# Patient Record
Sex: Female | Born: 1995 | Race: Black or African American | Hispanic: No | Marital: Single | Smoking: Never smoker
Health system: Southern US, Community
[De-identification: ages and names within clinical notes are randomized; demographics above are authoritative.]

## PROBLEM LIST (undated history)

## (undated) DIAGNOSIS — O26899 Other specified pregnancy related conditions, unspecified trimester: Secondary | ICD-10-CM

## (undated) DIAGNOSIS — F909 Attention-deficit hyperactivity disorder, unspecified type: Secondary | ICD-10-CM

## (undated) DIAGNOSIS — O10919 Unspecified pre-existing hypertension complicating pregnancy, unspecified trimester: Secondary | ICD-10-CM

## (undated) DIAGNOSIS — Z6791 Unspecified blood type, Rh negative: Secondary | ICD-10-CM

## (undated) HISTORY — PX: NO PAST SURGERIES: SHX2092

## (undated) HISTORY — DX: Unspecified pre-existing hypertension complicating pregnancy, unspecified trimester: O10.919

## (undated) HISTORY — DX: Attention-deficit hyperactivity disorder, unspecified type: F90.9

## (undated) HISTORY — DX: Unspecified blood type, rh negative: Z67.91

## (undated) HISTORY — DX: Other specified pregnancy related conditions, unspecified trimester: O26.899

---

## 2016-12-07 NOTE — L&D Delivery Note (Signed)
Operative Delivery Note At 6:51 PM a viable female was delivered via Vaginal, Vacuum Investment banker, operational(Extractor).  Presentation: vertex; Position: Occiput,, Posterior; Station: +3.  Verbal consent: obtained from patient.  Risks and benefits discussed in detail.  Risks include, but are not limited to the risks of anesthesia, bleeding, infection, damage to maternal tissues, fetal cephalhematoma.  There is also the risk of inability to effect vaginal delivery of the head, or shoulder dystocia that cannot be resolved by established maneuvers, leading to the need for emergency cesarean section. Kiwi vac was placed at crowning station pt was assisted x 3 contractions with delivery of fetal head. Poor maternal effort with pushing. Dr. Macon LargeAnyanwu notified of placement. Shoulder dystocia x approximately 4 min and 40 sec. Attempted McRoberts procedure, Woods screw maneuver, delivery of posterior arm several times but his was unsuccessful.  Patient assisted to hands and knees and delivery of posterior shoulder was successful on 2nd attempt. Then anterior shoulder was delivered with delivery of rest of infant and passed of to NICU for care. Cord gas obtained.    APGAR: 0, 3, 6  Arterial cord pH: 7.32 Placenta status:spont ,shultz .   Cord: 3VC  with the following complications: None.  Anesthesia: epidural   Instruments: kiwi Episiotomy: None Lacerations: 4th degree Suture Repair: Done by Dr. Macon LargeAnyanwu in usual fashion Est. Blood Loss (mL): 500  Mom to postpartum.  Baby to NICU.  Olivia Simmons, CNM 08/08/2017, 7:56 PM

## 2016-12-31 ENCOUNTER — Emergency Department (HOSPITAL_COMMUNITY)
Admission: EM | Admit: 2016-12-31 | Discharge: 2016-12-31 | Disposition: A | Discharge status: Home / Self Care | Payer: Self-pay | Attending: Emergency Medicine | Admitting: Emergency Medicine

## 2016-12-31 ENCOUNTER — Encounter (HOSPITAL_COMMUNITY): Payer: Self-pay

## 2016-12-31 DIAGNOSIS — Z331 Pregnant state, incidental: Secondary | ICD-10-CM | POA: Insufficient documentation

## 2016-12-31 LAB — COMPREHENSIVE METABOLIC PANEL
ALBUMIN: 3.3 g/dL — AB (ref 3.5–5.0)
ALT: 17 U/L (ref 14–54)
AST: 13 U/L — AB (ref 15–41)
Alkaline Phosphatase: 86 U/L (ref 38–126)
Anion gap: 7 (ref 5–15)
BILIRUBIN TOTAL: 0.8 mg/dL (ref 0.3–1.2)
BUN: 7 mg/dL (ref 6–20)
CHLORIDE: 104 mmol/L (ref 101–111)
CO2: 21 mmol/L — ABNORMAL LOW (ref 22–32)
CREATININE: 0.76 mg/dL (ref 0.44–1.00)
Calcium: 9.3 mg/dL (ref 8.9–10.3)
GFR calc Af Amer: >60 mL/min (ref >60)
Glucose, Bld: 87 mg/dL (ref 65–99)
POTASSIUM: 3.9 mmol/L (ref 3.5–5.1)
Sodium: 132 mmol/L — ABNORMAL LOW (ref 135–145)
TOTAL PROTEIN: 6.8 g/dL (ref 6.5–8.1)

## 2016-12-31 LAB — PREGNANCY, URINE: Preg Test, Ur: POSITIVE — AB

## 2016-12-31 LAB — CBC
HEMATOCRIT: 38 % (ref 36.0–46.0)
Hemoglobin: 12.6 g/dL (ref 12.0–15.0)
MCH: 29 pg (ref 26.0–34.0)
MCHC: 33.2 g/dL (ref 30.0–36.0)
MCV: 87.6 fL (ref 78.0–100.0)
PLATELETS: 270 10*3/uL (ref 150–400)
RBC: 4.34 MIL/uL (ref 3.87–5.11)
RDW: 13.9 % (ref 11.5–15.5)
WBC: 10.1 10*3/uL (ref 4.0–10.5)

## 2016-12-31 LAB — URINALYSIS, ROUTINE W REFLEX MICROSCOPIC
BILIRUBIN URINE: NEGATIVE
GLUCOSE, UA: NEGATIVE mg/dL
Hgb urine dipstick: NEGATIVE
KETONES UR: NEGATIVE mg/dL
LEUKOCYTES UA: NEGATIVE
Nitrite: NEGATIVE
PH: 6 (ref 5.0–8.0)
PROTEIN: NEGATIVE mg/dL
Specific Gravity, Urine: 1.023 (ref 1.005–1.030)

## 2016-12-31 LAB — LIPASE, BLOOD: Lipase: 24 U/L (ref 11–51)

## 2016-12-31 MED ORDER — ONDANSETRON 4 MG PO TBDP
4.0000 mg | ORAL_TABLET | Freq: Once | ORAL | Status: AC
  Administered 2016-12-31: 4 mg via ORAL
  Filled 2016-12-31: qty 1

## 2016-12-31 NOTE — ED Triage Notes (Signed)
Per Pt, Pt is coming from home with complaints of vomiting and nausea that happened two days ago in the morning. Subsided and came back this morning. Reports some midcenter abdominal pain along with belching. Denies diarrhea or urinary symptoms.

## 2016-12-31 NOTE — ED Provider Notes (Signed)
[facility]-EMERGENCY DEPT Provider Note   CSN: [id number] Arrival date & time: 12/31/16  1106  By signing my name below, I, Rosario AdieWilliam Andrew Hiatt, attest that this documentation has been prepared under the direction and in the presence of Lavera Guiseana Duo Arrianna Catala, MD. Electronically Signed: Rosario AdieWilliam Andrew Hiatt, ED Scribe. 12/31/16. 12:32 PM.  History   Chief Complaint Chief Complaint  Patient presents with  . Emesis  . Nausea   The history is provided by the patient. No language interpreter was used.    HPI Comments: Olivia Simmons is a 21 y.o. female with no pertinent PMHx, who presents to the Emergency Department complaining of intermittent episodes of nausea and vomiting beginning two days ago, recurrent since this morning. She quantifies her episodes of vomiting at two episodes since onset, and she has only had one episode of vomiting today. She states that prior to the onset of her symptoms that she ate suspicious noodles, and notes that she has had a similar episode in the past when eating this as a child. No recent illnesses/infections. No prior SHx. Pt is not currently on BCP. LNMP: 11/23/16. She denies diarrhea, fever, chills, vaginal bleeding/discharge, urgency, frequency, hematuria, dysuria, difficulty urinating, cough, abdominal pain, congestion, rhinorrhea, generalized myalgias, or any other associated symptoms.   History reviewed. No pertinent past medical history.  There are no active problems to display for this patient.  History reviewed. No pertinent surgical history.  OB History    No data available     Home Medications    Prior to Admission medications   Not on File   Family History No family history on file.  Social History Social History  Substance Use Topics  . Smoking status: Never Smoker  . Smokeless tobacco: Never Used  . Alcohol use No   Allergies   Patient has no known allergies.  Review of Systems Review of Systems 10/14 systems reviewed and are negative  other than those stated in the HPI  Physical Exam Updated Vital Signs BP 104/74   Pulse 98   Temp 98.5 F (36.9 C) (Oral)   Resp 16   Ht 5\' 2"  (1.575 m)   Wt 196 lb (88.9 kg)   LMP 11/23/2016   SpO2 100%   BMI 35.85 kg/m   Physical Exam Physical Exam  Nursing note and vitals reviewed. Constitutional: Well developed, well nourished, non-toxic, and in no acute distress Head: Normocephalic and atraumatic.  Mouth/Throat: Oropharynx is clear and moist.  Neck: Normal range of motion. Neck supple.  Cardiovascular: Normal rate and regular rhythm.   Pulmonary/Chest: Effort normal and breath sounds normal.  Abdominal: Soft. There is no tenderness. There is no rebound and no guarding.  Musculoskeletal: Normal range of motion.  Neurological: Alert, no facial droop, fluent speech, moves all extremities symmetrically Skin: Skin is warm and dry.  Psychiatric: Cooperative  ED Treatments / Results  DIAGNOSTIC STUDIES: Oxygen Saturation is 100% on RA, normal by my interpretation.   COORDINATION OF CARE: 12:30 PM-Discussed next steps with pt including basic blood work and UA. Pt verbalized understanding and is agreeable with the plan.   Labs (all labs ordered are listed, but only abnormal results are displayed) Labs Reviewed  COMPREHENSIVE METABOLIC PANEL - Abnormal; Notable for the following:       Result Value   Sodium 132 (*)    CO2 21 (*)    Albumin 3.3 (*)    AST 13 (*)    All other components within normal limits  URINALYSIS,  ROUTINE W REFLEX MICROSCOPIC - Abnormal; Notable for the following:    APPearance CLOUDY (*)    All other components within normal limits  PREGNANCY, URINE - Abnormal; Notable for the following:    Preg Test, Ur POSITIVE (*)    All other components within normal limits  LIPASE, BLOOD  CBC  POC URINE PREG, ED   EKG  EKG Interpretation None      Radiology No results found.  Procedures Procedures  Medications Ordered in ED Medications    ondansetron (ZOFRAN-ODT) disintegrating tablet 4 mg (4 mg Oral Given 12/31/16 1237)    Initial Impression / Assessment and Plan / ED Course  I have reviewed the triage vital signs and the nursing notes.  Pertinent labs & imaging results that were available during my care of the patient were reviewed by me and considered in my medical decision making (see chart for details).     Otherwise healthy female who presents with 2 isolated episodes of vomiting over the past 3 days. She is well-appearing, well-hydrated, and in no acute distress. Vital signs within normal limits. Abdomen soft and benign. Blood work overall reassuring and urinalysis without signs of infection. She does have a positive pregnancy test. Given no abnormal vaginal bleeding or abdominal pain. Does not require workup for ectopic pregnancy or other complications of pregnancy at this time. No complaint of vaginal discharge or significant pelvic pain to warrant need for a pelvic exam at this time. Given referral to OB. Strict return and follow-up instructions reviewed. She expressed understanding of all discharge instructions and felt comfortable with the plan of care.   Final Clinical Impressions(s) / ED Diagnoses   Final diagnoses:  Incidental pregnancy    New Prescriptions New Prescriptions   No medications on file   I personally performed the services described in this documentation, which was scribed in my presence. The recorded information has been reviewed and is accurate.    Lavera Guise, MD 12/31/16 1340

## 2016-12-31 NOTE — Discharge Instructions (Signed)
You have a positive pregnancy test.  You are given follow-up information above for OB.  Please return without fail for worsening symptoms, including vaginal bleeding, severe abdominal pain, intractable vomiting or any other symptoms concerning to you

## 2017-02-04 ENCOUNTER — Other Ambulatory Visit (HOSPITAL_COMMUNITY): Payer: Self-pay | Admitting: Nurse Practitioner

## 2017-02-04 DIAGNOSIS — Z3682 Encounter for antenatal screening for nuchal translucency: Secondary | ICD-10-CM

## 2017-02-04 DIAGNOSIS — Z3A13 13 weeks gestation of pregnancy: Secondary | ICD-10-CM

## 2017-02-04 LAB — OB RESULTS CONSOLE RPR: RPR: NONREACTIVE

## 2017-02-04 LAB — OB RESULTS CONSOLE ABO/RH: RH TYPE: NEGATIVE

## 2017-02-04 LAB — OB RESULTS CONSOLE GC/CHLAMYDIA
Chlamydia: NEGATIVE
Gonorrhea: NEGATIVE

## 2017-02-04 LAB — OB RESULTS CONSOLE ANTIBODY SCREEN: Antibody Screen: NEGATIVE

## 2017-02-04 LAB — URINE CULTURE
Cystic Fibrosis Profile: NEGATIVE
Glucose 1 Hr Prenatal, POC: 138 mg/dL
URINE CULTURE, OB: NEGATIVE

## 2017-02-04 LAB — OB RESULTS CONSOLE HEPATITIS B SURFACE ANTIGEN: HEP B S AG: NEGATIVE

## 2017-02-04 LAB — OB RESULTS CONSOLE RUBELLA ANTIBODY, IGM: Rubella: NON-IMMUNE/NOT IMMUNE

## 2017-02-04 LAB — OB RESULTS CONSOLE VARICELLA ZOSTER ANTIBODY, IGG: Varicella: IMMUNE

## 2017-02-23 ENCOUNTER — Encounter: Payer: Self-pay | Admitting: *Deleted

## 2017-02-23 ENCOUNTER — Encounter (HOSPITAL_COMMUNITY): Payer: Self-pay

## 2017-02-24 ENCOUNTER — Encounter: Payer: Self-pay | Admitting: *Deleted

## 2017-02-24 ENCOUNTER — Ambulatory Visit (HOSPITAL_COMMUNITY): Payer: Self-pay

## 2017-02-24 ENCOUNTER — Ambulatory Visit (HOSPITAL_COMMUNITY)
Admission: RE | Admit: 2017-02-24 | Discharge: 2017-02-24 | Disposition: A | Discharge status: Home / Self Care | Payer: Self-pay | Source: Ambulatory Visit | Attending: Nurse Practitioner | Admitting: Nurse Practitioner

## 2017-02-24 ENCOUNTER — Encounter (HOSPITAL_COMMUNITY): Payer: Self-pay

## 2017-02-24 DIAGNOSIS — F329 Major depressive disorder, single episode, unspecified: Secondary | ICD-10-CM

## 2017-02-24 DIAGNOSIS — O10919 Unspecified pre-existing hypertension complicating pregnancy, unspecified trimester: Secondary | ICD-10-CM

## 2017-02-24 DIAGNOSIS — O99340 Other mental disorders complicating pregnancy, unspecified trimester: Principal | ICD-10-CM

## 2017-02-24 DIAGNOSIS — O0990 Supervision of high risk pregnancy, unspecified, unspecified trimester: Secondary | ICD-10-CM

## 2017-02-24 HISTORY — DX: Unspecified pre-existing hypertension complicating pregnancy, unspecified trimester: O10.919

## 2017-02-25 ENCOUNTER — Encounter: Payer: Self-pay | Admitting: Family Medicine

## 2017-02-25 ENCOUNTER — Encounter: Payer: Self-pay | Admitting: Obstetrics and Gynecology

## 2017-02-25 DIAGNOSIS — O26899 Other specified pregnancy related conditions, unspecified trimester: Secondary | ICD-10-CM

## 2017-02-25 DIAGNOSIS — E669 Obesity, unspecified: Secondary | ICD-10-CM | POA: Insufficient documentation

## 2017-02-25 DIAGNOSIS — O99210 Obesity complicating pregnancy, unspecified trimester: Secondary | ICD-10-CM | POA: Insufficient documentation

## 2017-02-25 DIAGNOSIS — Z6791 Unspecified blood type, Rh negative: Secondary | ICD-10-CM

## 2017-02-25 DIAGNOSIS — E66812 Obesity, class 2: Secondary | ICD-10-CM | POA: Insufficient documentation

## 2017-02-25 HISTORY — DX: Other specified pregnancy related conditions, unspecified trimester: O26.899

## 2017-02-25 HISTORY — DX: Unspecified blood type, rh negative: Z67.91

## 2017-02-25 NOTE — Progress Notes (Signed)
Patient did not keep referral OB appointment for 02/25/2017.  Jacoby Zanni, Jr MD Attending [facility] (Faculty Practice)   

## 2017-03-15 ENCOUNTER — Ambulatory Visit (INDEPENDENT_AMBULATORY_CARE_PROVIDER_SITE_OTHER): Payer: Self-pay | Admitting: Family Medicine

## 2017-03-15 ENCOUNTER — Ambulatory Visit (INDEPENDENT_AMBULATORY_CARE_PROVIDER_SITE_OTHER): Payer: Self-pay | Admitting: Clinical

## 2017-03-15 VITALS — BP 122/69 | HR 90 | Wt 225.8 lb

## 2017-03-15 DIAGNOSIS — O09899 Supervision of other high risk pregnancies, unspecified trimester: Secondary | ICD-10-CM | POA: Insufficient documentation

## 2017-03-15 DIAGNOSIS — Z6791 Unspecified blood type, Rh negative: Secondary | ICD-10-CM

## 2017-03-15 DIAGNOSIS — Z283 Underimmunization status: Secondary | ICD-10-CM

## 2017-03-15 DIAGNOSIS — F4323 Adjustment disorder with mixed anxiety and depressed mood: Secondary | ICD-10-CM

## 2017-03-15 DIAGNOSIS — O0992 Supervision of high risk pregnancy, unspecified, second trimester: Secondary | ICD-10-CM

## 2017-03-15 DIAGNOSIS — O9989 Other specified diseases and conditions complicating pregnancy, childbirth and the puerperium: Secondary | ICD-10-CM

## 2017-03-15 DIAGNOSIS — O26899 Other specified pregnancy related conditions, unspecified trimester: Secondary | ICD-10-CM

## 2017-03-15 MED ORDER — CONCEPT OB 130-92.4-1 MG PO CAPS: 1.0000 | ORAL_CAPSULE | Freq: Every day | ORAL | 11 refills | Status: DC

## 2017-03-15 NOTE — Progress Notes (Signed)
   PRENATAL VISIT NOTE  Subjective:  Olivia Simmons is a 21 y.o. G1P0 at 46w0dbeing seen today for transferring prenatal care. [facility]. Had one BP there which was 130/85 and BP is normal here. She is currently monitored for the following issues for this low-risk pregnancy and has Depression affecting pregnancy; Supervision of high-risk pregnancy; Rh negative state in antepartum period; Obesity, Class II, BMI 35-39.9; Obesity in pregnancy; failed 1hr; and Rubella non-immune status, antepartum on her problem list.  Patient reports prenatal vitamins are making her sick.  Contractions: Not present. Vag. Bleeding: None.  Movement: Present. Denies leaking of fluid.   The following portions of the patient's history were reviewed and updated as appropriate: allergies, current medications, past family history, past medical history, past social history, past surgical history and problem list. Problem list updated.  Objective:   Vitals:   03/15/17 0817  BP: 122/69  Pulse: 90  Weight: 225 lb 12.8 oz (102.4 kg)    Fetal Status: Fetal Heart Rate (bpm): 150   Movement: Present     General:  Alert, oriented and cooperative. Patient is in no acute distress.  Skin: Skin is warm and dry. No rash noted.   Cardiovascular: Normal heart rate noted  Respiratory: Normal respiratory effort, no problems with respiration noted  Abdomen: Soft, gravid, appropriate for gestational age. Pain/Pressure: Present     Pelvic:  Cervical exam deferred        Extremities: Normal range of motion.  Edema: None  Mental Status: Normal mood and affect. Normal behavior. Normal judgment and thought content.   Assessment and Plan:  Pregnancy: G1P0 at 150w0d1. Supervision of high risk pregnancy in second trimester - Hemoglobin A1c-failed 1 hour at 138 - AFP, Quad Screen - [place] OB COMP + 14 WK; Future - Prenat w/o A Vit-FeFum-FePo-FA (CONCEPT OB) 130-92.4-1 MG CAPS; Take 1 capsule  by mouth daily.  Dispense: 30 capsule; Refill: 11---change to concept to see if this improves her nausea.  2. Rubella non-immune status, antepartum Needs MMR pp   General obstetric precautions including but not limited to vaginal bleeding, contractions, leaking of fluid and fetal movement were reviewed in detail with the patient. Please refer to After Visit Summary for other counseling recommendations.  [facility]  TaDonnamae JudeMD

## 2017-03-15 NOTE — BH Specialist Note (Signed)
Integrated Behavioral Health Initial Visit  MRN: [id number] Name: Olivia Simmons   Session Start time: 9:10 Session End time: 9:30 Total time: 20 minutes  Type of Service: Integrated Behavioral Health- Individual/Family Interpretor:No. Interpretor Name and Language: n/a   Warm Hand Off Completed.       SUBJECTIVE: Olivia Simmons is a 21 y.o. female accompanied by patient. Patient was referred by Dr Shawnie Pons for depression and anxiety. Patient reports the following symptoms/concerns: Pt states that her primary concern today is inadequate housing(living in hotel), inability to eat preferred foods(mostly fast food), and poor relationship between boyfriend and her parents, causing her to feel more anxious and depressed than usual. Pt admits to one panic attack in past month.  Duration of problem: Over one month; Severity of problem: moderate  OBJECTIVE: Mood: Anxious and Affect: Appropriate Risk of harm to self or others: No plan to harm self or others   LIFE CONTEXT: Family and Social: Lives with parents and 18yo brother in hotel in [place] School/Work: Surveyor, minerals at [employer]: Sleeps well, no known substances; fast food(not her preference) Life Changes: Current pregnancy and homelessness(6 months)  GOALS ADDRESSED: Patient will reduce symptoms of: anxiety and depression and increase knowledge and/or ability of: coping skills and also: Increase healthy adjustment to current life circumstances   INTERVENTIONS: Motivational Interviewing, Psychoeducation and/or Health Education and Link to [employer]  Standardized Assessments completed: GAD-7 and PHQ 9  ASSESSMENT: Patient currently experiencing Adjustment disorder with anxious and depressed mood. Patient may benefit from psychoeducation and brief therapeutic interventions regarding coping with symptoms of anxiety and depression, along with appropriate community resources.  PLAN: 1. Follow up with  behavioral health clinician on : one month 2. Behavioral recommendations:  -Consider community resources discussed in office visit([place] and [employer], [employer], [employer] Bab[employer] Program, [employer], [place] transportation, [employer]) -Continue utilizing [employer] services -Read educational material regarding coping with symptoms of anxiety with panic attack and depression 3. Referral(s): Integrated Art gallery manager (In Clinic) and [employer] Resources:  Arts administrator, Housing, English as a second language teacher and [employer] Moms, Healthy Babies, [facility] 4. "From scale of 1-10, how likely are you to follow plan?": 7  Rae Lips, LCSWA  Depression screen [facility] 2/9 03/15/2017  Decreased Interest 3  Down, Depressed, Hopeless 2  PHQ - 2 Score 5  Altered sleeping 2  Tired, decreased energy 2  Change in appetite 2  Feeling bad or failure about yourself  0  Trouble concentrating 0  Moving slowly or fidgety/restless 1  Suicidal thoughts 0  PHQ-9 Score 12   GAD 7 : Generalized Anxiety Score 03/15/2017  Nervous, Anxious, on Edge 3  Control/stop worrying 1  Worry too much - different things 2  Trouble relaxing 2  Restless 0  Easily annoyed or irritable 2  Afraid - awful might happen 2  Total GAD 7 Score 12

## 2017-03-15 NOTE — Patient Instructions (Signed)
 Second Trimester of Pregnancy The second trimester is from week 14 through week 27 (months 4 through 6). The second trimester is often a time when you feel your best. Your body has adjusted to being pregnant, and you begin to feel better physically. Usually, morning sickness has lessened or quit completely, you may have more energy, and you may have an increase in appetite. The second trimester is also a time when the fetus is growing rapidly. At the end of the sixth month, the fetus is about 9 inches long and weighs about 1 pounds. You will likely begin to feel the baby move (quickening) between 16 and 20 weeks of pregnancy. Body changes during your second trimester Your body continues to go through many changes during your second trimester. The changes vary from woman to woman.  Your weight will continue to increase. You will notice your lower abdomen bulging out.  You may begin to get stretch marks on your hips, abdomen, and breasts.  You may develop headaches that can be relieved by medicines. The medicines should be approved by your health care provider.  You may urinate more often because the fetus is pressing on your bladder.  You may develop or continue to have heartburn as a result of your pregnancy.  You may develop constipation because certain hormones are causing the muscles that push waste through your intestines to slow down.  You may develop hemorrhoids or swollen, bulging veins (varicose veins).  You may have back pain. This is caused by: ? Weight gain. ? Pregnancy hormones that are relaxing the joints in your pelvis. ? A shift in weight and the muscles that support your balance.  Your breasts will continue to grow and they will continue to become tender.  Your gums may bleed and may be sensitive to brushing and flossing.  Dark spots or blotches (chloasma, mask of pregnancy) may develop on your face. This will likely fade after the baby is born.  A dark line from  your belly button to the pubic area (linea nigra) may appear. This will likely fade after the baby is born.  You may have changes in your hair. These can include thickening of your hair, rapid growth, and changes in texture. Some women also have hair loss during or after pregnancy, or hair that feels dry or thin. Your hair will most likely return to normal after your baby is born.  What to expect at prenatal visits During a routine prenatal visit:  You will be weighed to make sure you and the fetus are growing normally.  Your blood pressure will be taken.  Your abdomen will be measured to track your baby's growth.  The fetal heartbeat will be listened to.  Any test results from the previous visit will be discussed.  Your health care provider may ask you:  How you are feeling.  If you are feeling the baby move.  If you have had any abnormal symptoms, such as leaking fluid, bleeding, severe headaches, or abdominal cramping.  If you are using any tobacco products, including cigarettes, chewing tobacco, and electronic cigarettes.  If you have any questions.  Other tests that may be performed during your second trimester include:  Blood tests that check for: ? Low iron levels (anemia). ? High blood sugar that affects pregnant women (gestational diabetes) between 24 and 28 weeks. ? Rh antibodies. This is to check for a protein on red blood cells (Rh factor).  Urine tests to check for infections, diabetes,   or protein in the urine.  An ultrasound to confirm the proper growth and development of the baby.  An amniocentesis to check for possible genetic problems.  Fetal screens for spina bifida and Down syndrome.  HIV (human immunodeficiency virus) testing. Routine prenatal testing includes screening for HIV, unless you choose not to have this test.  Follow these instructions at home: Medicines  Follow your health care provider's instructions regarding medicine use. Specific  medicines may be either safe or unsafe to take during pregnancy.  Take a prenatal vitamin that contains at least 600 micrograms (mcg) of folic acid.  If you develop constipation, try taking a stool softener if your health care provider approves. Eating and drinking  Eat a balanced diet that includes fresh fruits and vegetables, whole grains, good sources of protein such as meat, eggs, or tofu, and low-fat dairy. Your health care provider will help you determine the amount of weight gain that is right for you.  Avoid raw meat and uncooked cheese. These carry germs that can cause birth defects in the baby.  If you have low calcium intake from food, talk to your health care provider about whether you should take a daily calcium supplement.  Limit foods that are high in fat and processed sugars, such as fried and sweet foods.  To prevent constipation: ? Drink enough fluid to keep your urine clear or pale yellow. ? Eat foods that are high in fiber, such as fresh fruits and vegetables, whole grains, and beans. Activity  Exercise only as directed by your health care provider. Most women can continue their usual exercise routine during pregnancy. Try to exercise for 30 minutes at least 5 days a week. Stop exercising if you experience uterine contractions.  Avoid heavy lifting, wear low heel shoes, and practice good posture.  A sexual relationship may be continued unless your health care provider directs you otherwise. Relieving pain and discomfort  Wear a good support bra to prevent discomfort from breast tenderness.  Take warm sitz baths to soothe any pain or discomfort caused by hemorrhoids. Use hemorrhoid cream if your health care provider approves.  Rest with your legs elevated if you have leg cramps or low back pain.  If you develop varicose veins, wear support hose. Elevate your feet for 15 minutes, 3-4 times a day. Limit salt in your diet. Prenatal Care  Write down your questions.  Take them to your prenatal visits.  Keep all your prenatal visits as told by your health care provider. This is important. Safety  Wear your seat belt at all times when driving.  Make a list of emergency phone numbers, including numbers for family, friends, the hospital, and police and fire departments. General instructions  Ask your health care provider for a referral to a local prenatal education class. Begin classes no later than the beginning of month 6 of your pregnancy.  Ask for help if you have counseling or nutritional needs during pregnancy. Your health care provider can offer advice or refer you to specialists for help with various needs.  Do not use hot tubs, steam rooms, or saunas.  Do not douche or use tampons or scented sanitary pads.  Do not cross your legs for long periods of time.  Avoid cat litter boxes and soil used by cats. These carry germs that can cause birth defects in the baby and possibly loss of the fetus by miscarriage or stillbirth.  Avoid all smoking, herbs, alcohol, and unprescribed drugs. Chemicals in these products   can affect the formation and growth of the baby.  Do not use any products that contain nicotine or tobacco, such as cigarettes and e-cigarettes. If you need help quitting, ask your health care provider.  Visit your dentist if you have not gone yet during your pregnancy. Use a soft toothbrush to brush your teeth and be gentle when you floss. Contact a health care provider if:  You have dizziness.  You have mild pelvic cramps, pelvic pressure, or nagging pain in the abdominal area.  You have persistent nausea, vomiting, or diarrhea.  You have a bad smelling vaginal discharge.  You have pain when you urinate. Get help right away if:  You have a fever.  You are leaking fluid from your vagina.  You have spotting or bleeding from your vagina.  You have severe abdominal cramping or pain.  You have rapid weight gain or weight  loss.  You have shortness of breath with chest pain.  You notice sudden or extreme swelling of your face, hands, ankles, feet, or legs.  You have not felt your baby move in over an hour.  You have severe headaches that do not go away when you take medicine.  You have vision changes. Summary  The second trimester is from week 14 through week 27 (months 4 through 6). It is also a time when the fetus is growing rapidly.  Your body goes through many changes during pregnancy. The changes vary from woman to woman.  Avoid all smoking, herbs, alcohol, and unprescribed drugs. These chemicals affect the formation and growth your baby.  Do not use any tobacco products, such as cigarettes, chewing tobacco, and e-cigarettes. If you need help quitting, ask your health care provider.  Contact your health care provider if you have any questions. Keep all prenatal visits as told by your health care provider. This is important. This information is not intended to replace advice given to you by your health care provider. Make sure you discuss any questions you have with your health care provider. Document Released: 11/17/2001 Document Revised: 04/30/2016 Document Reviewed: 01/24/2013 [employer] Interactive Patient Education  2017 [employer]   Breastfeeding Deciding to breastfeed is one of the best choices you can make for you and your baby. A change in hormones during pregnancy causes your breast tissue to grow and increases the number and size of your milk ducts. These hormones also allow proteins, sugars, and fats from your blood supply to make breast milk in your milk-producing glands. Hormones prevent breast milk from being released before your baby is born as well as prompt milk flow after birth. Once breastfeeding has begun, thoughts of your baby, as well as his or her sucking or crying, can stimulate the release of milk from your milk-producing glands. Benefits of breastfeeding For Your  Baby  Your first milk (colostrum) helps your baby's digestive system function better.  There are antibodies in your milk that help your baby fight off infections.  Your baby has a lower incidence of asthma, allergies, and sudden infant death syndrome.  The nutrients in breast milk are better for your baby than infant formulas and are designed uniquely for your baby's needs.  Breast milk improves your baby's brain development.  Your baby is less likely to develop other conditions, such as childhood obesity, asthma, or type 2 diabetes mellitus.  For You  Breastfeeding helps to create a very special bond between you and your baby.  Breastfeeding is convenient. Breast milk is always available at   the correct temperature and costs nothing.  Breastfeeding helps to burn calories and helps you lose the weight gained during pregnancy.  Breastfeeding makes your uterus contract to its prepregnancy size faster and slows bleeding (lochia) after you give birth.  Breastfeeding helps to lower your risk of developing type 2 diabetes mellitus, osteoporosis, and breast or ovarian cancer later in life.  Signs that your baby is hungry Early Signs of Hunger  Increased alertness or activity.  Stretching.  Movement of the head from side to side.  Movement of the head and opening of the mouth when the corner of the mouth or cheek is stroked (rooting).  Increased sucking sounds, smacking lips, cooing, sighing, or squeaking.  Hand-to-mouth movements.  Increased sucking of fingers or hands.  Late Signs of Hunger  Fussing.  Intermittent crying.  Extreme Signs of Hunger Signs of extreme hunger will require calming and consoling before your baby will be able to breastfeed successfully. Do not wait for the following signs of extreme hunger to occur before you initiate breastfeeding:  Restlessness.  A loud, strong cry.  Screaming.  Breastfeeding basics Breastfeeding Initiation  Find a  comfortable place to sit or lie down, with your neck and back well supported.  Place a pillow or rolled up blanket under your baby to bring him or her to the level of your breast (if you are seated). Nursing pillows are specially designed to help support your arms and your baby while you breastfeed.  Make sure that your baby's abdomen is facing your abdomen.  Gently massage your breast. With your fingertips, massage from your chest wall toward your nipple in a circular motion. This encourages milk flow. You may need to continue this action during the feeding if your milk flows slowly.  Support your breast with 4 fingers underneath and your thumb above your nipple. Make sure your fingers are well away from your nipple and your baby's mouth.  Stroke your baby's lips gently with your finger or nipple.  When your baby's mouth is open wide enough, quickly bring your baby to your breast, placing your entire nipple and as much of the colored area around your nipple (areola) as possible into your baby's mouth. ? More areola should be visible above your baby's upper lip than below the lower lip. ? Your baby's tongue should be between his or her lower gum and your breast.  Ensure that your baby's mouth is correctly positioned around your nipple (latched). Your baby's lips should create a seal on your breast and be turned out (everted).  It is common for your baby to suck about 2-3 minutes in order to start the flow of breast milk.  Latching Teaching your baby how to latch on to your breast properly is very important. An improper latch can cause nipple pain and decreased milk supply for you and poor weight gain in your baby. Also, if your baby is not latched onto your nipple properly, he or she may swallow some air during feeding. This can make your baby fussy. Burping your baby when you switch breasts during the feeding can help to get rid of the air. However, teaching your baby to latch on properly is  still the best way to prevent fussiness from swallowing air while breastfeeding. Signs that your baby has successfully latched on to your nipple:  Silent tugging or silent sucking, without causing you pain.  Swallowing heard between every 3-4 sucks.  Muscle movement above and in front of his or her   ears while sucking.  Signs that your baby has not successfully latched on to nipple:  Sucking sounds or smacking sounds from your baby while breastfeeding.  Nipple pain.  If you think your baby has not latched on correctly, slip your finger into the corner of your baby's mouth to break the suction and place it between your baby's gums. Attempt breastfeeding initiation again. Signs of Successful Breastfeeding Signs from your baby:  A gradual decrease in the number of sucks or complete cessation of sucking.  Falling asleep.  Relaxation of his or her body.  Retention of a small amount of milk in his or her mouth.  Letting go of your breast by himself or herself.  Signs from you:  Breasts that have increased in firmness, weight, and size 1-3 hours after feeding.  Breasts that are softer immediately after breastfeeding.  Increased milk volume, as well as a change in milk consistency and color by the fifth day of breastfeeding.  Nipples that are not sore, cracked, or bleeding.  Signs That Your Baby is Getting Enough Milk  Wetting at least 1-2 diapers during the first 24 hours after birth.  Wetting at least 5-6 diapers every 24 hours for the first week after birth. The urine should be clear or pale yellow by 5 days after birth.  Wetting 6-8 diapers every 24 hours as your baby continues to grow and develop.  At least 3 stools in a 24-hour period by age 5 days. The stool should be soft and yellow.  At least 3 stools in a 24-hour period by age 7 days. The stool should be seedy and yellow.  No loss of weight greater than 10% of birth weight during the first 3 days of age.  Average  weight gain of 4-7 ounces (113-198 g) per week after age 4 days.  Consistent daily weight gain by age 5 days, without weight loss after the age of 2 weeks.  After a feeding, your baby may spit up a small amount. This is common. Breastfeeding frequency and duration Frequent feeding will help you make more milk and can prevent sore nipples and breast engorgement. Breastfeed when you feel the need to reduce the fullness of your breasts or when your baby shows signs of hunger. This is called "breastfeeding on demand." Avoid introducing a pacifier to your baby while you are working to establish breastfeeding (the first 4-6 weeks after your baby is born). After this time you may choose to use a pacifier. Research has shown that pacifier use during the first year of a baby's life decreases the risk of sudden infant death syndrome (SIDS). Allow your baby to feed on each breast as long as he or she wants. Breastfeed until your baby is finished feeding. When your baby unlatches or falls asleep while feeding from the first breast, offer the second breast. Because newborns are often sleepy in the first few weeks of life, you may need to awaken your baby to get him or her to feed. Breastfeeding times will vary from baby to baby. However, the following rules can serve as a guide to help you ensure that your baby is properly fed:  Newborns (babies 4 weeks of age or younger) may breastfeed every 1-3 hours.  Newborns should not go longer than 3 hours during the day or 5 hours during the night without breastfeeding.  You should breastfeed your baby a minimum of 8 times in a 24-hour period until you begin to introduce solid foods to your   baby at around 6 months of age.  Breast milk pumping Pumping and storing breast milk allows you to ensure that your baby is exclusively fed your breast milk, even at times when you are unable to breastfeed. This is especially important if you are going back to work while you are still  breastfeeding or when you are not able to be present during feedings. Your lactation consultant can give you guidelines on how long it is safe to store breast milk. A breast pump is a machine that allows you to pump milk from your breast into a sterile bottle. The pumped breast milk can then be stored in a refrigerator or freezer. Some breast pumps are operated by hand, while others use electricity. Ask your lactation consultant which type will work best for you. Breast pumps can be purchased, but some hospitals and breastfeeding support groups lease breast pumps on a monthly basis. A lactation consultant can teach you how to hand express breast milk, if you prefer not to use a pump. Caring for your breasts while you breastfeed Nipples can become dry, cracked, and sore while breastfeeding. The following recommendations can help keep your breasts moisturized and healthy:  Avoid using soap on your nipples.  Wear a supportive bra. Although not required, special nursing bras and tank tops are designed to allow access to your breasts for breastfeeding without taking off your entire bra or top. Avoid wearing underwire-style bras or extremely tight bras.  Air dry your nipples for 3-4minutes after each feeding.  Use only cotton bra pads to absorb leaked breast milk. Leaking of breast milk between feedings is normal.  Use lanolin on your nipples after breastfeeding. Lanolin helps to maintain your skin's normal moisture barrier. If you use pure lanolin, you do not need to wash it off before feeding your baby again. Pure lanolin is not toxic to your baby. You may also hand express a few drops of breast milk and gently massage that milk into your nipples and allow the milk to air dry.  In the first few weeks after giving birth, some women experience extremely full breasts (engorgement). Engorgement can make your breasts feel heavy, warm, and tender to the touch. Engorgement peaks within 3-5 days after you give  birth. The following recommendations can help ease engorgement:  Completely empty your breasts while breastfeeding or pumping. You may want to start by applying warm, moist heat (in the shower or with warm water-soaked hand towels) just before feeding or pumping. This increases circulation and helps the milk flow. If your baby does not completely empty your breasts while breastfeeding, pump any extra milk after he or she is finished.  Wear a snug bra (nursing or regular) or tank top for 1-2 days to signal your body to slightly decrease milk production.  Apply ice packs to your breasts, unless this is too uncomfortable for you.  Make sure that your baby is latched on and positioned properly while breastfeeding.  If engorgement persists after 48 hours of following these recommendations, contact your health care provider or a lactation consultant. Overall health care recommendations while breastfeeding  Eat healthy foods. Alternate between meals and snacks, eating 3 of each per day. Because what you eat affects your breast milk, some of the foods may make your baby more irritable than usual. Avoid eating these foods if you are sure that they are negatively affecting your baby.  Drink milk, fruit juice, and water to satisfy your thirst (about 10 glasses a day).    Rest often, relax, and continue to take your prenatal vitamins to prevent fatigue, stress, and anemia.  Continue breast self-awareness checks.  Avoid chewing and smoking tobacco. Chemicals from cigarettes that pass into breast milk and exposure to secondhand smoke may harm your baby.  Avoid alcohol and drug use, including marijuana. Some medicines that may be harmful to your baby can pass through breast milk. It is important to ask your health care provider before taking any medicine, including all over-the-counter and prescription medicine as well as vitamin and herbal supplements. It is possible to become pregnant while breastfeeding.  If birth control is desired, ask your health care provider about options that will be safe for your baby. Contact a health care provider if:  You feel like you want to stop breastfeeding or have become frustrated with breastfeeding.  You have painful breasts or nipples.  Your nipples are cracked or bleeding.  Your breasts are red, tender, or warm.  You have a swollen area on either breast.  You have a fever or chills.  You have nausea or vomiting.  You have drainage other than breast milk from your nipples.  Your breasts do not become full before feedings by the fifth day after you give birth.  You feel sad and depressed.  Your baby is too sleepy to eat well.  Your baby is having trouble sleeping.  Your baby is wetting less than 3 diapers in a 24-hour period.  Your baby has less than 3 stools in a 24-hour period.  Your baby's skin or the white part of his or her eyes becomes yellow.  Your baby is not gaining weight by 5 days of age. Get help right away if:  Your baby is overly tired (lethargic) and does not want to wake up and feed.  Your baby develops an unexplained fever. This information is not intended to replace advice given to you by your health care provider. Make sure you discuss any questions you have with your health care provider. Document Released: 11/23/2005 Document Revised: 05/06/2016 Document Reviewed: 05/17/2013 [employer] Interactive Patient Education  2017 [employer]  

## 2017-03-18 LAB — AFP, QUAD SCREEN
DIA MOM VALUE: 0.56
DIA VALUE (EIA): 73.8 pg/mL
DSR (BY AGE) 1 IN: 1139
DSR (Second Trimester) 1 IN: 10000
GESTATIONAL AGE AFP: 17 wk
MATERNAL AGE AT EDD: 21.5 a
MSAFP Mom: 1.17
MSAFP: 37.9 ng/mL
MSHCG MOM: 0.72
MSHCG: 17499 m[IU]/mL
OSB RISK: 10000
T18 (By Age): 1:4436 {titer}
Test Results:: NEGATIVE
UE3 MOM: 1.39
UE3 VALUE: 1.25 ng/mL
Weight: 225 [lb_av]

## 2017-03-18 LAB — HEMOGLOBIN A1C
Est. average glucose Bld gHb Est-mCnc: 105 mg/dL
HEMOGLOBIN A1C: 5.3 % (ref 4.8–5.6)

## 2017-04-05 ENCOUNTER — Ambulatory Visit (HOSPITAL_COMMUNITY)
Admission: RE | Admit: 2017-04-05 | Discharge: 2017-04-05 | Disposition: A | Discharge status: Home / Self Care | Payer: 59 | Source: Ambulatory Visit | Attending: Family Medicine | Admitting: Family Medicine

## 2017-04-05 ENCOUNTER — Other Ambulatory Visit: Payer: Self-pay | Admitting: Family Medicine

## 2017-04-05 ENCOUNTER — Encounter (HOSPITAL_COMMUNITY): Payer: Self-pay

## 2017-04-05 VITALS — BP 113/78 | HR 94 | Wt 233.8 lb

## 2017-04-05 DIAGNOSIS — Z369 Encounter for antenatal screening, unspecified: Secondary | ICD-10-CM

## 2017-04-05 DIAGNOSIS — I10 Essential (primary) hypertension: Secondary | ICD-10-CM | POA: Diagnosis not present

## 2017-04-05 DIAGNOSIS — O99212 Obesity complicating pregnancy, second trimester: Secondary | ICD-10-CM | POA: Diagnosis present

## 2017-04-05 DIAGNOSIS — O10919 Unspecified pre-existing hypertension complicating pregnancy, unspecified trimester: Secondary | ICD-10-CM

## 2017-04-05 DIAGNOSIS — O0992 Supervision of high risk pregnancy, unspecified, second trimester: Secondary | ICD-10-CM | POA: Diagnosis not present

## 2017-04-05 DIAGNOSIS — Z3A19 19 weeks gestation of pregnancy: Secondary | ICD-10-CM | POA: Diagnosis not present

## 2017-04-14 ENCOUNTER — Encounter: Payer: Self-pay | Admitting: Medical

## 2017-04-14 ENCOUNTER — Ambulatory Visit: Payer: Self-pay

## 2017-04-14 NOTE — BH Specialist Note (Deleted)
Integrated Behavioral Health Follow Up Visit  MRN: [id number] Name: Olivia AdaLindsay C Lippens   Session Start time: *** Session End time: *** Total time: {IBH Total OZHY:[id number]}Time:21014050} Number of Integrated Behavioral Health Clinician visits: 2/10  Type of Service: Integrated Behavioral Health- Individual/Family Interpretor:No. Interpretor Name and Language: n/a   Warm Hand Off Completed.       SUBJECTIVE: Olivia Simmons is a 21 y.o. female accompanied by {Persons; PED relatives w/patient:19415}. Patient was referred by *** for ***. Patient reports the following symptoms/concerns: *** Duration of problem: ***; Severity of problem: {Mild/Moderate/Severe:20260}  OBJECTIVE: Mood: {BHH MOOD:22306} and Affect: {BHH AFFECT:22307} Risk of harm to self or others: {CHL AMB BH Suicide Current Mental Status:21022748}   LIFE CONTEXT: Family and Social: *** School/Work: *** Self-Care: *** Life Changes: ***  GOALS ADDRESSED: Patient will reduce symptoms of: {IBH Symptoms:21014056} and increase knowledge and/or ability of: {IBH Patient Tools:21014057} and also: {IBH Goals:21014053}  INTERVENTIONS: {IBH Interventions:21014054} Standardized Assessments completed: {IBH Screening Tools:21014051}  ASSESSMENT: Patient currently experiencing ***. Patient may benefit from ***.  PLAN: 1. Follow up with behavioral health clinician on : *** 2. Behavioral recommendations: *** 3. Referral(s): {IBH Referrals:21014055} 4. "From scale of 1-10, how likely are you to follow plan?": ***  Itali Mckendry C Ermagene Saidi, LCSWA

## 2017-04-22 ENCOUNTER — Ambulatory Visit (INDEPENDENT_AMBULATORY_CARE_PROVIDER_SITE_OTHER): Payer: 59 | Admitting: Obstetrics and Gynecology

## 2017-04-22 ENCOUNTER — Encounter: Payer: Self-pay | Admitting: Obstetrics & Gynecology

## 2017-04-22 ENCOUNTER — Ambulatory Visit (INDEPENDENT_AMBULATORY_CARE_PROVIDER_SITE_OTHER): Payer: 59 | Admitting: Clinical

## 2017-04-22 VITALS — BP 117/71 | HR 84 | Wt 244.3 lb

## 2017-04-22 DIAGNOSIS — O99210 Obesity complicating pregnancy, unspecified trimester: Secondary | ICD-10-CM

## 2017-04-22 DIAGNOSIS — F4323 Adjustment disorder with mixed anxiety and depressed mood: Secondary | ICD-10-CM | POA: Diagnosis not present

## 2017-04-22 DIAGNOSIS — O0992 Supervision of high risk pregnancy, unspecified, second trimester: Secondary | ICD-10-CM

## 2017-04-22 NOTE — BH Specialist Note (Signed)
Integrated Behavioral Health Follow Up Visit  MRN: [id number] Name: Olivia AdaLindsay C [place]   Session Start time: 8:50 Session End time: 9:14 Total time: 24 minutes Number of Integrated Behavioral Health Clinician visits: 2/10  Type of Service: Integrated Behavioral Health- Individual/Family Interpretor:No. Interpretor Name and Language: n/a   Warm Hand Off Completed.       SUBJECTIVE: Olivia Simmons is a 21 y.o. female accompanied by patient. Patient was referred by f/u Dr. Shawnie PonsPratt for depression and anxiety. Patient reports the following symptoms/concerns: Pt states her primary concern is feeling anxious about housing; she and FOB want to find housing together, before baby arrives. Pt also concerned over having to drop failed class at school, along with transportation issues. 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 Patient may benefit from Continued brief interventions regarding coping with symptoms of anxiety and depression.  PLAN: 1. Follow up with behavioral health clinician on : One month 2. Behavioral recommendations:   -Call [facility] today, to confirm any necessary paperwork to initiate housing eligibility(ask specifically about [facility] application) -Call [facility] today, to set up resource counseling appointment -Call [facility] tomorrow, 04-23-17, to inquire about Healthy Moms, Healthy Babies program 3. Referral(s): [facility])   Valetta CloseJamie C [place], [place]  Depression screen [facility] 2/9 04/22/2017 03/15/2017  Decreased Interest 3 3  Down, Depressed, Hopeless 0 2  PHQ - 2 Score 3 5  Altered sleeping 2 2  Tired, decreased energy 1 2  Change in appetite 0 2  Feeling bad or failure about yourself  0 0  Trouble concentrating 0 0  Moving slowly or fidgety/restless 0 1  Suicidal thoughts 0 0  PHQ-9 Score 6 12   GAD 7 : Generalized Anxiety Score 04/22/2017 03/15/2017  Nervous, Anxious, on Edge 0 3  Control/stop worrying 2 1  Worry too much - different things 2 2  Trouble relaxing 2 2  Restless 2 0  Easily annoyed or irritable 2 2  Afraid - awful might happen 2 2  Total GAD 7 Score 12 12

## 2017-04-22 NOTE — Patient Instructions (Signed)

## 2017-04-22 NOTE — Progress Notes (Signed)
   PRENATAL VISIT NOTE  Subjective:  Olivia Simmons is a 21 y.o. G1P0 at 2028w3d being seen today for ongoing prenatal care.  She is currently monitored for the following issues for this high-risk pregnancy and has Depression affecting pregnancy; Supervision of high-risk pregnancy; Rh negative state in antepartum period; Obesity, Class II, BMI 35-39.9; Obesity in pregnancy; failed 1hr; and Rubella non-immune status, antepartum on her problem list.  Patient reports Patient is complaining of headaches 2 times a week. They have just started recently. They have been improving with a single dose of IBU. She is drinking about 4-5 bottles of water a day, but has not increased her intake with the increased temperature outside..  Contractions: Not present. Vag. Bleeding: None.  Movement: Present. Denies leaking of fluid.   The following portions of the patient's history were reviewed and updated as appropriate: allergies, current medications, past family history, past medical history, past social history, past surgical history and problem list. Problem list updated.  Objective:   Vitals:   04/22/17 0816  BP: 117/71  Pulse: 84  Weight: 244 lb 4.8 oz (110.8 kg)    Fetal Status: Fetal Heart Rate (bpm): 148   Movement: Present     General:  Alert, oriented and cooperative. Patient is in no acute distress.  Skin: Skin is warm and dry. No rash noted.   Cardiovascular: Normal heart rate noted  Respiratory: Normal respiratory effort, no problems with respiration noted  Abdomen: Soft, gravid, appropriate for gestational age. Pain/Pressure: Absent     Pelvic:  Cervical exam deferred        Extremities: Normal range of motion.  Edema: None  Mental Status: Normal mood and affect. Normal behavior. Normal judgment and thought content.   Assessment and Plan:  Pregnancy: G1P0 at 4528w3d  1. Supervision of high risk pregnancy in second trimester -doing well -failed 1 hour early in preg - HgBA1c was checked  and is 5.3. Will routinely test at 28 wks. - follow up in 3 wks and then 4 wks to have labs at 28 wks.  2. Obesity in pregnancy Counseled on diet.  3. Depression in pregnancy Seeing jamie today  Preterm labor symptoms and general obstetric precautions including but not limited to vaginal bleeding, contractions, leaking of fluid and fetal movement were reviewed in detail with the patient. Please refer to After Visit Summary for other counseling recommendations.  Return in about 3 weeks (around 05/13/2017).   Ernestina PennaNicholas Schenk, MD

## 2017-05-17 ENCOUNTER — Telehealth: Payer: Self-pay | Admitting: Advanced Practice Midwife

## 2017-05-17 ENCOUNTER — Encounter: Payer: Self-pay | Admitting: Advanced Practice Midwife

## 2017-05-17 ENCOUNTER — Ambulatory Visit (HOSPITAL_COMMUNITY): Payer: 59 | Attending: Family Medicine

## 2017-05-17 ENCOUNTER — Encounter (HOSPITAL_COMMUNITY): Payer: Self-pay

## 2017-05-17 NOTE — Telephone Encounter (Signed)
Patient called at 9:45 to say she was going to be a little late for her 9:40 appointment. I informed her that her appointment was at 9:20. She stated she was on her way. I spoke with the nurse to inform her of this patient going to be late. We will inform patient when she gets here, due to her being late she will be called last.

## 2017-05-19 ENCOUNTER — Ambulatory Visit (INDEPENDENT_AMBULATORY_CARE_PROVIDER_SITE_OTHER): Payer: 59 | Admitting: Obstetrics and Gynecology

## 2017-05-19 VITALS — BP 128/75 | HR 72 | Wt 244.0 lb

## 2017-05-19 DIAGNOSIS — O0992 Supervision of high risk pregnancy, unspecified, second trimester: Secondary | ICD-10-CM | POA: Diagnosis not present

## 2017-05-19 NOTE — Patient Instructions (Signed)
Fetal Movement Counts Patient Name: ________________________________________________ Patient Due Date: ____________________ What is a fetal movement count? A fetal movement count is the number of times that you feel your baby move during a certain amount of time. This may also be called a fetal kick count. A fetal movement count is recommended for every pregnant woman. You may be asked to start counting fetal movements as early as week 28 of your pregnancy. Pay attention to when your baby is most active. You may notice your baby's sleep and wake cycles. You may also notice things that make your baby move more. You should do a fetal movement count:  When your baby is normally most active.  At the same time each day.  A good time to count movements is while you are resting, after having something to eat and drink. How do I count fetal movements? 1. Find a quiet, comfortable area. Sit, or lie down on your side. 2. Write down the date, the start time and stop time, and the number of movements that you felt between those two times. Take this information with you to your health care visits. 3. For 2 hours, count kicks, flutters, swishes, rolls, and jabs. You should feel at least 10 movements during 2 hours. 4. You may stop counting after you have felt 10 movements. 5. If you do not feel 10 movements in 2 hours, have something to eat and drink. Then, keep resting and counting for 1 hour. If you feel at least 4 movements during that hour, you may stop counting. Contact a health care provider if:  You feel fewer than 4 movements in 2 hours.  Your baby is not moving like he or she usually does. Date: ____________ Start time: ____________ Stop time: ____________ Movements: ____________ Date: ____________ Start time: ____________ Stop time: ____________ Movements: ____________ Date: ____________ Start time: ____________ Stop time: ____________ Movements: ____________ Date: ____________ Start time:  ____________ Stop time: ____________ Movements: ____________ Date: ____________ Start time: ____________ Stop time: ____________ Movements: ____________ Date: ____________ Start time: ____________ Stop time: ____________ Movements: ____________ Date: ____________ Start time: ____________ Stop time: ____________ Movements: ____________ Date: ____________ Start time: ____________ Stop time: ____________ Movements: ____________ Date: ____________ Start time: ____________ Stop time: ____________ Movements: ____________ This information is not intended to replace advice given to you by your health care provider. Make sure you discuss any questions you have with your health care provider. Document Released: 12/23/2006 Document Revised: 07/22/2016 Document Reviewed: 01/02/2016 Elsevier Interactive Patient Education  2018 [employer] Glucose Tolerance Test The glucose tolerance test (GTT) is one of several tests used to diagnose diabetes mellitus. The GTT is a blood test, and it may include a urine test as well. The GTT checks to see how your body processes sugar (glucose). For this test, you will consume a drink containing a high level of glucose. Your blood glucose levels will be checked before you consume the drink and then again 1, 2, 3, and possibly 4 hours after you consume it. Your health care provider may recommend that you have the GTT if you:  Have a family history of diabetes.  Are very overweight (obese).  Have experienced infections that keep coming back.  Have had numerous cuts or wounds that did not heal quickly, especially on your legs and feet.  Are a woman and have a history of giving birth to very large babies or a history of repeated fetal loss (stillbirth).  Have had glucose in your urine or high blood sugar: ?  During pregnancy. ? After a heart attack, surgery, or prolonged periods of high stress.  The GTT lasts 3-4 hours. Other than the glucose solution, you will not be  allowed to eat or drink anything during the test. You must remain at the testing location to make sure that your blood and urine samples are taken on time. How do I prepare for this test? Eat normally for 3 days prior to the GTT test, including having plenty of carbohydrate-rich foods. Do not eat or drink anything except water during the final 12 hours before the test. You should not smoke or exercise during the test. In addition, your health care provider may ask you to stop taking certain medicines before the test. What do the results mean? It is your responsibility to obtain your test results. Ask the lab or department performing the test when and how you will get your results. Contact your health care provider to discuss any questions you have about your results. Range of Normal Values Ranges for normal values may vary among different labs and hospitals. You should always check with your health care provider after having lab work or other tests done to discuss whether your values are considered within normal limits. Normal levels of blood glucose are as follows:  Fasting: less than 110 mg/dL or less than 6.1 mmol/L (SI units).  1 hour after consuming the glucose drink: less than 200 mg/dL or less than 16.1 mmol/L.  2 hours after consuming the glucose drink: less than 140 mg/dL or less than 7.8 mmol/L.  3 hours after consuming the glucose drink: 70-115 mg/dL or less than 6.4 mmol/L.  4 hours after consuming the glucose drink: 70-115 mg/dL or less than 6.4 mmol/L.  The normal result for the urine test is negative, meaning that glucose is absent from your urine. Some substances can interfere with GTT results. These may include:  Blood pressure and heart failure medicines, including beta blockers, furosemide, and thiazides.  Anti-inflammatory medicines, including aspirin.  Nicotine.  Some psychiatric medicines.  Oral contraceptives.  Diuretics or corticosteroids.  Meaning of Results  Outside Normal Value Ranges GTT test results that are above normal values may indicate health problems, such as:  Diabetes mellitus.  Acute stress response.  Cushing syndrome.  Tumors such as pheochromocytoma or glucagonoma.  Chronic renal failure.  Pancreatitis.  Hyperthyroidism.  Current infection.  Discuss your test results with your health care provider. He or she will use the results to make a diagnosis and determine a treatment plan that is right for you. Talk with your health care provider to discuss your results, treatment options, and if necessary, the need for more tests. Talk with your health care provider if you have any questions about your results. This information is not intended to replace advice given to you by your health care provider. Make sure you discuss any questions you have with your health care provider. Document Released: 12/16/2004 Document Revised: 07/28/2016 Document Reviewed: 03/30/2014 Elsevier Interactive Patient Education  2017 [employer]. Third Trimester of Pregnancy The third trimester is from week 28 through week 40 (months 7 through 9). The third trimester is a time when the unborn baby (fetus) is growing rapidly. At the end of the ninth month, the fetus is about 20 inches in length and weighs 6-10 pounds. Body changes during your third trimester Your body will continue to go through many changes during pregnancy. The changes vary from woman to woman. During the third trimester:  Your weight will continue  to increase. You can expect to gain 25-35 pounds (11-16 kg) by the end of the pregnancy.  You may begin to get stretch marks on your hips, abdomen, and breasts.  You may urinate more often because the fetus is moving lower into your pelvis and pressing on your bladder.  You may develop or continue to have heartburn. This is caused by increased hormones that slow down muscles in the digestive tract.  You may develop or continue to have  constipation because increased hormones slow digestion and cause the muscles that push waste through your intestines to relax.  You may develop hemorrhoids. These are swollen veins (varicose veins) in the rectum that can itch or be painful.  You may develop swollen, bulging veins (varicose veins) in your legs.  You may have increased body aches in the pelvis, back, or thighs. This is due to weight gain and increased hormones that are relaxing your joints.  You may have changes in your hair. These can include thickening of your hair, rapid growth, and changes in texture. Some women also have hair loss during or after pregnancy, or hair that feels dry or thin. Your hair will most likely return to normal after your baby is born.  Your breasts will continue to grow and they will continue to become tender. A yellow fluid (colostrum) may leak from your breasts. This is the first milk you are producing for your baby.  Your belly button may stick out.  You may notice more swelling in your hands, face, or ankles.  You may have increased tingling or numbness in your hands, arms, and legs. The skin on your belly may also feel numb.  You may feel short of breath because of your expanding uterus.  You may have more problems sleeping. This can be caused by the size of your belly, increased need to urinate, and an increase in your body's metabolism.  You may notice the fetus "dropping," or moving lower in your abdomen (lightening).  You may have increased vaginal discharge.  You may notice your joints feel loose and you may have pain around your pelvic bone.  What to expect at prenatal visits You will have prenatal exams every 2 weeks until week 36. Then you will have weekly prenatal exams. During a routine prenatal visit:  You will be weighed to make sure you and the baby are growing normally.  Your blood pressure will be taken.  Your abdomen will be measured to track your baby's growth.  The  fetal heartbeat will be listened to.  Any test results from the previous visit will be discussed.  You may have a cervical check near your due date to see if your cervix has softened or thinned (effaced).  You will be tested for Group B streptococcus. This happens between 35 and 37 weeks.  Your health care provider may ask you:  What your birth plan is.  How you are feeling.  If you are feeling the baby move.  If you have had any abnormal symptoms, such as leaking fluid, bleeding, severe headaches, or abdominal cramping.  If you are using any tobacco products, including cigarettes, chewing tobacco, and electronic cigarettes.  If you have any questions.  Other tests or screenings that may be performed during your third trimester include:  Blood tests that check for low iron levels (anemia).  Fetal testing to check the health, activity level, and growth of the fetus. Testing is done if you have certain medical conditions or if  there are problems during the pregnancy.  Nonstress test (NST). This test checks the health of your baby to make sure there are no signs of problems, such as the baby not getting enough oxygen. During this test, a belt is placed around your belly. The baby is made to move, and its heart rate is monitored during movement.  What is false labor? False labor is a condition in which you feel small, irregular tightenings of the muscles in the womb (contractions) that usually go away with rest, changing position, or drinking water. These are called Braxton Hicks contractions. Contractions may last for hours, days, or even weeks before true labor sets in. If contractions come at regular intervals, become more frequent, increase in intensity, or become painful, you should see your health care provider. What are the signs of labor?  Abdominal cramps.  Regular contractions that start at 10 minutes apart and become stronger and more frequent with time.  Contractions  that start on the top of the uterus and spread down to the lower abdomen and back.  Increased pelvic pressure and dull back pain.  A watery or bloody mucus discharge that comes from the vagina.  Leaking of amniotic fluid. This is also known as your "water breaking." It could be a slow trickle or a gush. Let your health care provider know if it has a color or strange odor. If you have any of these signs, call your health care provider right away, even if it is before your due date. Follow these instructions at home: Medicines  Follow your health care provider's instructions regarding medicine use. Specific medicines may be either safe or unsafe to take during pregnancy.  Take a prenatal vitamin that contains at least 600 micrograms (mcg) of folic acid.  If you develop constipation, try taking a stool softener if your health care provider approves. Eating and drinking  Eat a balanced diet that includes fresh fruits and vegetables, whole grains, good sources of protein such as meat, eggs, or tofu, and low-fat dairy. Your health care provider will help you determine the amount of weight gain that is right for you.  Avoid raw meat and uncooked cheese. These carry germs that can cause birth defects in the baby.  If you have low calcium intake from food, talk to your health care provider about whether you should take a daily calcium supplement.  Eat four or five small meals rather than three large meals a day.  Limit foods that are high in fat and processed sugars, such as fried and sweet foods.  To prevent constipation: ? Drink enough fluid to keep your urine clear or pale yellow. ? Eat foods that are high in fiber, such as fresh fruits and vegetables, whole grains, and beans. Activity  Exercise only as directed by your health care provider. Most women can continue their usual exercise routine during pregnancy. Try to exercise for 30 minutes at least 5 days a week. Stop exercising if you  experience uterine contractions.  Avoid heavy lifting.  Do not exercise in extreme heat or humidity, or at high altitudes.  Wear low-heel, comfortable shoes.  Practice good posture.  You may continue to have sex unless your health care provider tells you otherwise. Relieving pain and discomfort  Take frequent breaks and rest with your legs elevated if you have leg cramps or low back pain.  Take warm sitz baths to soothe any pain or discomfort caused by hemorrhoids. Use hemorrhoid cream if your health care provider  approves.  Wear a good support bra to prevent discomfort from breast tenderness.  If you develop varicose veins: ? Wear support pantyhose or compression stockings as told by your healthcare provider. ? Elevate your feet for 15 minutes, 3-4 times a day. Prenatal care  Write down your questions. Take them to your prenatal visits.  Keep all your prenatal visits as told by your health care provider. This is important. Safety  Wear your seat belt at all times when driving.  Make a list of emergency phone numbers, including numbers for family, friends, the hospital, and police and fire departments. General instructions  Avoid cat litter boxes and soil used by cats. These carry germs that can cause birth defects in the baby. If you have a cat, ask someone to clean the litter box for you.  Do not travel far distances unless it is absolutely necessary and only with the approval of your health care provider.  Do not use hot tubs, steam rooms, or saunas.  Do not drink alcohol.  Do not use any products that contain nicotine or tobacco, such as cigarettes and e-cigarettes. If you need help quitting, ask your health care provider.  Do not use any medicinal herbs or unprescribed drugs. These chemicals affect the formation and growth of the baby.  Do not douche or use tampons or scented sanitary pads.  Do not cross your legs for long periods of time.  To prepare for the  arrival of your baby: ? Take prenatal classes to understand, practice, and ask questions about labor and delivery. ? Make a trial run to the hospital. ? Visit the hospital and tour the maternity area. ? Arrange for maternity or paternity leave through employers. ? Arrange for family and friends to take care of pets while you are in the hospital. ? Purchase a rear-facing car seat and make sure you know how to install it in your car. ? Pack your hospital bag. ? Prepare the baby's nursery. Make sure to remove all pillows and stuffed animals from the baby's crib to prevent suffocation.  Visit your dentist if you have not gone during your pregnancy. Use a soft toothbrush to brush your teeth and be gentle when you floss. Contact a health care provider if:  You are unsure if you are in labor or if your water has broken.  You become dizzy.  You have mild pelvic cramps, pelvic pressure, or nagging pain in your abdominal area.  You have lower back pain.  You have persistent nausea, vomiting, or diarrhea.  You have an unusual or bad smelling vaginal discharge.  You have pain when you urinate. Get help right away if:  Your water breaks before 37 weeks.  You have regular contractions less than 5 minutes apart before 37 weeks.  You have a fever.  You are leaking fluid from your vagina.  You have spotting or bleeding from your vagina.  You have severe abdominal pain or cramping.  You have rapid weight loss or weight gain.  You have shortness of breath with chest pain.  You notice sudden or extreme swelling of your face, hands, ankles, feet, or legs.  Your baby makes fewer than 10 movements in 2 hours.  You have severe headaches that do not go away when you take medicine.  You have vision changes. Summary  The third trimester is from week 28 through week 40, months 7 through 9. The third trimester is a time when the unborn baby (fetus) is growing rapidly.  During the third  trimester, your discomfort may increase as you and your baby continue to gain weight. You may have abdominal, leg, and back pain, sleeping problems, and an increased need to urinate.  During the third trimester your breasts will keep growing and they will continue to become tender. A yellow fluid (colostrum) may leak from your breasts. This is the first milk you are producing for your baby.  False labor is a condition in which you feel small, irregular tightenings of the muscles in the womb (contractions) that eventually go away. These are called Braxton Hicks contractions. Contractions may last for hours, days, or even weeks before true labor sets in.  Signs of labor can include: abdominal cramps; regular contractions that start at 10 minutes apart and become stronger and more frequent with time; watery or bloody mucus discharge that comes from the vagina; increased pelvic pressure and dull back pain; and leaking of amniotic fluid. This information is not intended to replace advice given to you by your health care provider. Make sure you discuss any questions you have with your health care provider. Document Released: 11/17/2001 Document Revised: 04/30/2016 Document Reviewed: 01/24/2013 Elsevier Interactive Patient Education  2017 [employer]

## 2017-05-19 NOTE — Progress Notes (Signed)
   PRENATAL VISIT NOTE  Subjective:  Olivia Simmons is a 21 y.o. G1P0 at 2930w2d being seen today for ongoing prenatal care.  She is currently monitored for the following issues for this high-risk pregnancy and has Depression affecting pregnancy; Supervision of high-risk pregnancy; Rh negative state in antepartum period; Obesity, Class II, BMI 35-39.9; Obesity in pregnancy; failed 1hr; and Rubella non-immune status, antepartum on her problem list.  Patient reports no complaints.  Contractions: Not present.  .  Movement: Present. Denies leaking of fluid.   The following portions of the patient's history were reviewed and updated as appropriate: allergies, current medications, past family history, past medical history, past social history, past surgical history and problem list. Problem list updated.  Objective:   Vitals:   05/19/17 0841  BP: 128/75  Pulse: 72  Weight: 244 lb (110.7 kg)    Fetal Status: Fetal Heart Rate (bpm): 150 Fundal Height: 30 cm Movement: Present     General:  Alert, oriented and cooperative. Patient is in no acute distress.  Skin: Skin is warm and dry. No rash noted.   Cardiovascular: Normal heart rate noted  Respiratory: Normal respiratory effort, no problems with respiration noted  Abdomen: Soft, gravid, appropriate for gestational age. Pain/Pressure: Absent     Pelvic:  Cervical exam deferred        Extremities: Normal range of motion.  Edema: None  Mental Status: Normal mood and affect. Normal behavior. Normal judgment and thought content.   Assessment and Plan:  Pregnancy: G1P0 at 7030w2d  There are no diagnoses linked to this encounter. Preterm labor symptoms and general obstetric precautions including but not limited to vaginal bleeding, contractions, leaking of fluid and fetal movement were reviewed in detail with the patient. Please refer to After Visit Summary for other counseling recommendations.  Return in about 3 weeks (around 06/09/2017) for Return  OB visit. Anticipatory guidance of fasting 2 hr GTT at next visit.   Raelyn Moraolitta Janmarie Smoot, CNM

## 2017-05-31 ENCOUNTER — Other Ambulatory Visit: Payer: 59

## 2017-05-31 DIAGNOSIS — Z3480 Encounter for supervision of other normal pregnancy, unspecified trimester: Secondary | ICD-10-CM

## 2017-06-01 LAB — GLUCOSE TOLERANCE, 2 HOURS W/ 1HR
GLUCOSE, FASTING: 99 mg/dL — AB (ref 65–91)
Glucose, 1 hour: 201 mg/dL — ABNORMAL HIGH (ref 65–179)
Glucose, 2 hour: 187 mg/dL — ABNORMAL HIGH (ref 65–152)

## 2017-06-01 LAB — CBC
HEMATOCRIT: 34.9 % (ref 34.0–46.6)
Hemoglobin: 11.3 g/dL (ref 11.1–15.9)
MCH: 29.5 pg (ref 26.6–33.0)
MCHC: 32.4 g/dL (ref 31.5–35.7)
MCV: 91 fL (ref 79–97)
PLATELETS: 234 10*3/uL (ref 150–379)
RBC: 3.83 x10E6/uL (ref 3.77–5.28)
RDW: 14.3 % (ref 12.3–15.4)
WBC: 13.9 10*3/uL — ABNORMAL HIGH (ref 3.4–10.8)

## 2017-06-01 LAB — RPR: RPR Ser Ql: NONREACTIVE

## 2017-06-01 LAB — HIV ANTIBODY (ROUTINE TESTING W REFLEX): HIV SCREEN 4TH GENERATION: NONREACTIVE

## 2017-06-02 ENCOUNTER — Other Ambulatory Visit: Payer: Self-pay | Admitting: *Deleted

## 2017-06-02 NOTE — Telephone Encounter (Signed)
Per message from Dr. Alysia PennaErvin called [facility]. Unable to leave message due to heard message voicemail is full.

## 2017-06-02 NOTE — Telephone Encounter (Signed)
-----   Message from Hermina StaggersMichael L Ervin, MD sent at 06/01/2017 10:01 AM EDT ----- Please let pt know that she failed gluocla and has GDM Please send in BS monitor, lacents and strips for her Refer to diabetic education. BS goals fasting < 90 and 2 hr PP < 120 Tell pt to bring BS readings to all OB appts Thanks Casimiro NeedleMichael

## 2017-06-08 NOTE — Telephone Encounter (Signed)
Patient has an appointment on Thursday July 5. Note made on her visit to inform her of diagnosis and make referral to diabetes education. I was unable to reach her by phone today.

## 2017-06-10 ENCOUNTER — Encounter: Payer: Self-pay | Admitting: Advanced Practice Midwife

## 2017-06-10 ENCOUNTER — Telehealth: Payer: Self-pay | Admitting: Family Medicine

## 2017-06-10 NOTE — Telephone Encounter (Signed)
Called patient due to missed appointment. Rescheduled 2 hr gtt and follow-up. Mother confirmed both appointments.

## 2017-06-14 ENCOUNTER — Other Ambulatory Visit (INDEPENDENT_AMBULATORY_CARE_PROVIDER_SITE_OTHER): Payer: 59

## 2017-06-14 ENCOUNTER — Other Ambulatory Visit: Payer: 59

## 2017-06-14 DIAGNOSIS — O360920 Maternal care for other rhesus isoimmunization, second trimester, not applicable or unspecified: Secondary | ICD-10-CM

## 2017-06-14 DIAGNOSIS — O24419 Gestational diabetes mellitus in pregnancy, unspecified control: Secondary | ICD-10-CM

## 2017-06-14 DIAGNOSIS — Z6791 Unspecified blood type, Rh negative: Secondary | ICD-10-CM

## 2017-06-14 DIAGNOSIS — O26892 Other specified pregnancy related conditions, second trimester: Secondary | ICD-10-CM

## 2017-06-14 MED ORDER — RHO D IMMUNE GLOBULIN 1500 UNIT/2ML IJ SOSY
300.0000 ug | PREFILLED_SYRINGE | Freq: Once | INTRAMUSCULAR | Status: AC
  Administered 2017-06-14: 300 ug via INTRAMUSCULAR

## 2017-06-14 NOTE — Progress Notes (Signed)
Pt here today for 2 hr.  Looking in pt's chart pt does not need to have 2 hr she is already dx gestational diabetic from lab results from 05/31/17.  Diabetes Education scheduled for July 12th @ 1400 in MFM after OB appt.  Pt notified.   Pt shows concern of not receiving her Rhogam.  Pt is Rh-, antibody screen [facility]  Notified Dr. Adrian BlackwaterStinson, rhogam administered today.    Pt requests to hear FHR due to newly dx of gestational diabetes and concerned for baby.  FHR 146 bpm.  Pt stated thank you and did not have any other questions.

## 2017-06-17 ENCOUNTER — Ambulatory Visit (HOSPITAL_COMMUNITY): Payer: 59

## 2017-06-17 ENCOUNTER — Encounter: Payer: Self-pay | Admitting: Family Medicine

## 2017-06-17 ENCOUNTER — Encounter: Payer: Self-pay | Admitting: Medical

## 2017-06-17 ENCOUNTER — Ambulatory Visit (INDEPENDENT_AMBULATORY_CARE_PROVIDER_SITE_OTHER): Payer: 59 | Admitting: Medical

## 2017-06-17 DIAGNOSIS — Z23 Encounter for immunization: Secondary | ICD-10-CM | POA: Diagnosis not present

## 2017-06-17 DIAGNOSIS — O24410 Gestational diabetes mellitus in pregnancy, diet controlled: Secondary | ICD-10-CM

## 2017-06-17 DIAGNOSIS — O99810 Abnormal glucose complicating pregnancy: Secondary | ICD-10-CM

## 2017-06-17 DIAGNOSIS — O0992 Supervision of high risk pregnancy, unspecified, second trimester: Secondary | ICD-10-CM

## 2017-06-17 LAB — POCT URINALYSIS DIP (DEVICE)
Bilirubin Urine: NEGATIVE
Glucose, UA: 100 mg/dL — AB
HGB URINE DIPSTICK: NEGATIVE
Ketones, ur: NEGATIVE mg/dL
Leukocytes, UA: NEGATIVE
Nitrite: NEGATIVE
PH: 6 (ref 5.0–8.0)
PROTEIN: 30 mg/dL — AB
Specific Gravity, Urine: >=1.03 (ref 1.005–1.030)
UROBILINOGEN UA: 2 mg/dL — AB (ref 0.0–1.0)

## 2017-06-17 MED ORDER — ACCU-CHEK FASTCLIX LANCETS MISC: 1.0000 | Freq: Four times a day (QID) | 12 refills | Status: DC

## 2017-06-17 MED ORDER — GLUCOSE BLOOD VI STRP: ORAL_STRIP | 12 refills | Status: DC

## 2017-06-17 MED ORDER — ACCU-CHEK GUIDE W/DEVICE KIT: 1.0000 | PACK | Freq: Four times a day (QID) | 0 refills | Status: DC

## 2017-06-17 NOTE — Patient Instructions (Signed)

## 2017-06-17 NOTE — Progress Notes (Signed)
   PRENATAL VISIT NOTE  Subjective:  Olivia Simmons is a 21 y.o. G1P0 at 48w3dbeing seen today for ongoing prenatal care.  She is currently monitored for the following issues for this high-risk pregnancy and has Depression affecting pregnancy; Supervision of high-risk pregnancy; Rh negative state in antepartum period; Obesity, Class II, BMI 35-39.9; Obesity in pregnancy; failed 1hr; Rubella non-immune status, antepartum; and Gestational diabetes mellitus (GDM) on her problem list.  Patient reports no complaints.  Contractions: Not present. Vag. Bleeding: None.  Movement: Present. Denies leaking of fluid.   The following portions of the patient's history were reviewed and updated as appropriate: allergies, current medications, past family history, past medical history, past social history, past surgical history and problem list. Problem list updated.  Objective:   Vitals:   06/17/17 1045  BP: 118/72  Pulse: (!) 106  Weight: 248 lb 8 oz (112.7 kg)    Fetal Status: Fetal Heart Rate (bpm): 135 Fundal Height: 33 cm Movement: Present     General:  Alert, oriented and cooperative. Patient is in no acute distress.  Skin: Skin is warm and dry. No rash noted.   Cardiovascular: Normal heart rate noted  Respiratory: Normal respiratory effort, no problems with respiration noted  Abdomen: Soft, gravid, appropriate for gestational age. Pain/Pressure: Present     Pelvic:  Cervical exam deferred        Extremities: Normal range of motion.  Edema: None  Mental Status: Normal mood and affect. Normal behavior. Normal judgment and thought content.   Assessment and Plan:  Pregnancy: G1P0 at 280w3d1. Supervision of high risk pregnancy in second trimester - Tdap vaccine greater than or equal to 7yo IM (deferred from last visit due to need for Rhogam injection at that time)  2. failed 1hr - Also failed 2 hour GTT  3. Diet controlled gestational diabetes mellitus (GDM) in third trimester - Patient  cancelled appointment with diabetes education for this afternoon due to transportation, will try to schedule ASAP - Patient encouraged to check blood sugar QID (fasting AM and PP) however may not be able to work supplies prior to diabetes education appointment  - Blood Glucose Monitoring Suppl (ACCU-CHEK GUIDE) w/Device KIT; 1 kit by Does not apply route 4 (four) times daily.  Dispense: 1 kit; Refill: 0 - glucose blood test strip; Use as instructed  Dispense: 50 each; Refill: 12 - ACCU-CHEK FASTCLIX LANCETS MISC; 1 each by Percutaneous route 4 (four) times daily.  Dispense: 100 each; Refill: 12  Preterm labor symptoms and general obstetric precautions including but not limited to vaginal bleeding, contractions, leaking of fluid and fetal movement were reviewed in detail with the patient. Please refer to After Visit Summary for other counseling recommendations.  Return in about 2 weeks (around 07/01/2017) [facility]   JuKerry HoughPA-C

## 2017-06-24 ENCOUNTER — Other Ambulatory Visit: Payer: Self-pay

## 2017-07-07 ENCOUNTER — Encounter: Payer: Self-pay | Admitting: Obstetrics and Gynecology

## 2017-07-08 ENCOUNTER — Encounter: Payer: Self-pay | Admitting: Advanced Practice Midwife

## 2017-07-08 ENCOUNTER — Other Ambulatory Visit: Payer: Self-pay

## 2017-07-09 ENCOUNTER — Encounter: Payer: Self-pay | Admitting: General Practice

## 2017-07-16 ENCOUNTER — Ambulatory Visit (INDEPENDENT_AMBULATORY_CARE_PROVIDER_SITE_OTHER): Payer: 59 | Admitting: Family Medicine

## 2017-07-16 VITALS — BP 115/85 | HR 108 | Wt 254.6 lb

## 2017-07-16 DIAGNOSIS — O24410 Gestational diabetes mellitus in pregnancy, diet controlled: Secondary | ICD-10-CM

## 2017-07-16 DIAGNOSIS — O0993 Supervision of high risk pregnancy, unspecified, third trimester: Secondary | ICD-10-CM

## 2017-07-16 DIAGNOSIS — O99210 Obesity complicating pregnancy, unspecified trimester: Secondary | ICD-10-CM

## 2017-07-16 DIAGNOSIS — Z6791 Unspecified blood type, Rh negative: Secondary | ICD-10-CM

## 2017-07-16 DIAGNOSIS — O26899 Other specified pregnancy related conditions, unspecified trimester: Secondary | ICD-10-CM

## 2017-07-16 DIAGNOSIS — O09893 Supervision of other high risk pregnancies, third trimester: Secondary | ICD-10-CM

## 2017-07-16 MED ORDER — ACCU-CHEK FASTCLIX LANCETS MISC: 1.0000 | Freq: Four times a day (QID) | 12 refills | Status: DC

## 2017-07-16 MED ORDER — ACCU-CHEK GUIDE W/DEVICE KIT: 1.0000 | PACK | Freq: Four times a day (QID) | 0 refills | Status: DC

## 2017-07-16 MED ORDER — GLUCOSE BLOOD VI STRP: ORAL_STRIP | 12 refills | Status: DC

## 2017-07-16 NOTE — Progress Notes (Signed)
Subjective:  Olivia Simmons is a 21 y.o. G1P0 at 51w4dbeing seen today for ongoing prenatal care.  She is currently monitored for the following issues for this high-risk pregnancy and has Depression affecting pregnancy; Supervision of high-risk pregnancy; Rh negative state in antepartum period; Obesity, Class II, BMI 35-39.9; Obesity in pregnancy; failed 1hr; Rubella non-immune status, antepartum; and Gestational diabetes mellitus (GDM) on her problem list.  Patient has missed last appt and has not picked up glucometer.  Patient reports no complaints. Also having itching around clitoris.  Contractions: Not present.  .  Movement: Present. Denies leaking of fluid.   The following portions of the patient's history were reviewed and updated as appropriate: allergies, current medications, past family history, past medical history, past social history, past surgical history and problem list. Problem list updated.  Objective:   Vitals:   07/16/17 0940  BP: 115/85  Pulse: (!) 108  Weight: 254 lb 9.6 oz (115.5 kg)    Fetal Status: Fetal Heart Rate (bpm): 145 Fundal Height: 36 cm Movement: Present     General:  Alert, oriented and cooperative. Patient is in no acute distress.  Skin: Skin is warm and dry. No rash noted.   Cardiovascular: Normal heart rate noted  Respiratory: Normal respiratory effort, no problems with respiration noted  Abdomen: Soft, gravid, appropriate for gestational age. Pain/Pressure: Absent     Pelvic:      No erythema Cervical exam deferred        Extremities: Normal range of motion.  Edema: None  Mental Status: Normal mood and affect. Normal behavior. Normal judgment and thought content.   Urinalysis:      Assessment and Plan:  Pregnancy: G1P0 at 346w4d1. Supervision of high risk pregnancy in third trimester FHT and FH. Use mild soaps.  2. Diet controlled gestational diabetes mellitus (GDM) in third trimester Discussed complications associated with GDM  including increased stillbirth, increased c/s rate, shoulder dystocia, anoxic brain injury, etc. Sent supplies to pharmacy. Has DM edu appt on Tuesday - father is diabetic and checks blood sugars - will help with checking until next appt. - ACCU-CHEK FASTCLIX LANCETS MISC; 1 each by Percutaneous route 4 (four) times daily.  Dispense: 100 each; Refill: 12 - Blood Glucose Monitoring Suppl (ACCU-CHEK GUIDE) w/Device KIT; 1 kit by Does not apply route 4 (four) times daily.  Dispense: 1 kit; Refill: 0 - glucose blood test strip; Use as instructed  Dispense: 50 each; Refill: 12  3. Obesity in pregnancy  4. Rh negative state in antepartum period Rhogam already given  Preterm labor symptoms and general obstetric precautions including but not limited to vaginal bleeding, contractions, leaking of fluid and fetal movement were reviewed in detail with the patient. Please refer to After Visit Summary for other counseling recommendations.  Return in about 4 days (around 07/20/2017) for HR OB f/u, DM recheck.   StTruett MainlandDO

## 2017-07-20 ENCOUNTER — Encounter: Payer: 59 | Attending: Obstetrics and Gynecology | Admitting: *Deleted

## 2017-07-20 ENCOUNTER — Ambulatory Visit: Payer: 59 | Admitting: *Deleted

## 2017-07-20 ENCOUNTER — Encounter: Payer: Self-pay | Admitting: Obstetrics & Gynecology

## 2017-07-20 DIAGNOSIS — R7309 Other abnormal glucose: Secondary | ICD-10-CM

## 2017-07-20 DIAGNOSIS — Z713 Dietary counseling and surveillance: Secondary | ICD-10-CM | POA: Insufficient documentation

## 2017-07-20 DIAGNOSIS — R7302 Impaired glucose tolerance (oral): Secondary | ICD-10-CM | POA: Insufficient documentation

## 2017-07-20 NOTE — Progress Notes (Signed)
  Patient was seen on 07/20/2017 for Gestational Diabetes self-management . She is here with her Dad who participated in the visit and states he has Type 2 diabetes. Patient states she was denied Medicaid on 2 occasions so I checked with admin team and they state she currently does have Medicaid. Patient encouraged to pick up Accu Chek Guide Meter based Medicaid coverage. The following learning objectives were met by the patient :   States the definition of Gestational Diabetes  States why dietary management is important in controlling blood glucose  Describes the effects of carbohydrates on blood glucose levels  Demonstrates ability to create a balanced meal plan  Demonstrates carbohydrate counting   States when to check blood glucose levels  Demonstrates proper blood glucose monitoring techniques  States the effect of stress and exercise on blood glucose levels  States the importance of limiting caffeine and abstaining from alcohol and smoking  Plan:  Aim for 3 Carb Choices per meal (45 grams) +/- 1 either way  Aim for 1-2 Carbs per snack Begin reading food labels for Total Carbohydrate of foods Consider  increasing your activity level by walking or other activity daily as tolerated Begin checking BG before breakfast and 2 hours after first bite of breakfast, lunch and dinner as directed by MD  Bring Log Book to every medical appointment   Take medication if directed by MD  I do not feel she is appropriate for Babyscripts at this time.   Blood glucose monitor Rx already called into pharmacy: [place] with Fast Clix drums Patient instructed to test pre breakfast and 2 hours each meal as directed by MD  Patient instructed to monitor glucose levels: FBS: 60 - 95 mg/dl 2 hour: <120 mg/dl  Patient received the following handouts:  Nutrition Diabetes and Pregnancy  Carbohydrate Counting List  BG Log Sheet  Patient will be seen for follow-up as needed.

## 2017-07-27 ENCOUNTER — Telehealth: Payer: Self-pay | Admitting: *Deleted

## 2017-07-27 DIAGNOSIS — O24410 Gestational diabetes mellitus in pregnancy, diet controlled: Secondary | ICD-10-CM

## 2017-07-27 MED ORDER — GLUCOSE BLOOD VI STRP: ORAL_STRIP | 12 refills | Status: DC

## 2017-07-27 NOTE — Telephone Encounter (Signed)
Pt left message stating that she needs her prescription sent to [facility] on [address] in [facility]. I called pt back and her father answered. He stated that Latyra is not available but she needs the prescription for test strips called in to the pharmacy.  I stated that she has refills available and to contact her pharmacy. He voiced understanding. Per chart review, pt was only prescribed #50 strips at a time. I sent new Rx with #100 strips for each refill as pt is checking blood sugar 4 times daily.

## 2017-08-03 ENCOUNTER — Other Ambulatory Visit (HOSPITAL_COMMUNITY)
Admission: RE | Admit: 2017-08-03 | Discharge: 2017-08-03 | Disposition: A | Discharge status: Home / Self Care | Payer: 59 | Source: Ambulatory Visit | Attending: Obstetrics and Gynecology | Admitting: Obstetrics and Gynecology

## 2017-08-03 ENCOUNTER — Ambulatory Visit (INDEPENDENT_AMBULATORY_CARE_PROVIDER_SITE_OTHER): Payer: 59 | Admitting: Obstetrics and Gynecology

## 2017-08-03 VITALS — BP 124/67 | HR 69 | Wt 255.1 lb

## 2017-08-03 DIAGNOSIS — Z3A00 Weeks of gestation of pregnancy not specified: Secondary | ICD-10-CM | POA: Insufficient documentation

## 2017-08-03 DIAGNOSIS — O24419 Gestational diabetes mellitus in pregnancy, unspecified control: Secondary | ICD-10-CM | POA: Insufficient documentation

## 2017-08-03 DIAGNOSIS — O26899 Other specified pregnancy related conditions, unspecified trimester: Secondary | ICD-10-CM

## 2017-08-03 DIAGNOSIS — O0990 Supervision of high risk pregnancy, unspecified, unspecified trimester: Secondary | ICD-10-CM | POA: Insufficient documentation

## 2017-08-03 DIAGNOSIS — E669 Obesity, unspecified: Secondary | ICD-10-CM

## 2017-08-03 DIAGNOSIS — Z6791 Unspecified blood type, Rh negative: Secondary | ICD-10-CM

## 2017-08-03 DIAGNOSIS — O99210 Obesity complicating pregnancy, unspecified trimester: Secondary | ICD-10-CM

## 2017-08-03 DIAGNOSIS — O09899 Supervision of other high risk pregnancies, unspecified trimester: Secondary | ICD-10-CM

## 2017-08-03 NOTE — Patient Instructions (Signed)
Your first thing in the morning, before you eat or drink anything, blood sugar should be less than 95 Check your blood sugar two hours after each meal. This number should be less than 120

## 2017-08-03 NOTE — Progress Notes (Signed)
Prenatal Visit Note Date: 08/03/2017 Clinic: [facility]  Subjective:  Olivia Simmons is a 21 y.o. G1P0 at [redacted]w[redacted]d being seen today for ongoing prenatal care.  She is currently monitored for the following issues for this high-risk pregnancy and has Depression affecting pregnancy; Supervision of high-risk pregnancy; Rh negative state in antepartum period; Obesity, Class II, BMI 35-39.9; Obesity in pregnancy; Rubella non-immune status, antepartum; and GDM (gestational diabetes mellitus) on her problem list.  Patient reports no complaints.   Contractions: Not present. Vag. Bleeding: None.  Movement: Present. Denies leaking of fluid.   The following portions of the patient's history were reviewed and updated as appropriate: allergies, current medications, past family history, past medical history, past social history, past surgical history and problem list. Problem list updated.  Objective:   Vitals:   08/03/17 1553  BP: 124/67  Pulse: 69  Weight: 255 lb 1.6 oz (115.7 kg)    Fetal Status: Fetal Heart Rate (bpm): 144 Fundal Height: 37 cm Movement: Present  Presentation: Vertex  General:  Alert, oriented and cooperative. Patient is in no acute distress.  Skin: Skin is warm and dry. No rash noted.   Cardiovascular: Normal heart rate noted  Respiratory: Normal respiratory effort, no problems with respiration noted  Abdomen: Soft, gravid, appropriate for gestational age. Pain/Pressure: Present     Pelvic:  Cervical exam deferred        Extremities: Normal range of motion.  Edema: Trace  Mental Status: Normal mood and affect. Normal behavior. Normal judgment and thought content.   Urinalysis:      Assessment and Plan:  Pregnancy: G1P0 at [redacted]w[redacted]d  1. Supervision of high risk pregnancy, antepartum Routine care. D/w pt re: BC nv - Culture, beta strep (group b only) - GC/Chlamydia probe amp ([place])not at [facility] - [place] MFM OB FOLLOW UP; Future  2. Gestational diabetes  mellitus (GDM) in third trimester, gestational diabetes method of control unspecified Pt states she lost insurance and has never checked her blood sugars. Supplies given today. Importance of good glycemic control stressed to pt and her mother.  - [place] MFM OB FOLLOW UP; Future  3. Rh negative state in antepartum period S/p rhogam at 28wks.   4. Obesity, Class II, BMI 35-39.9   5. Obesity in pregnancy   Preterm labor symptoms and general obstetric precautions including but not limited to vaginal bleeding, contractions, leaking of fluid and fetal movement were reviewed in detail with the patient. Please refer to After Visit Summary for other counseling recommendations.  Return in about 1 week (around 08/10/2017) for rob.   Chappell Bing, MD

## 2017-08-04 ENCOUNTER — Telehealth: Payer: Self-pay

## 2017-08-04 NOTE — Telephone Encounter (Signed)
Called patient to inform her of her US appointment scheduled for Wednesday September 5th at 3:45pm. Got voicemail. Left message with appointment details. Left message for patient to call the office if she has any questions.

## 2017-08-06 LAB — GC/CHLAMYDIA PROBE AMP (~~LOC~~) NOT AT ARMC
Chlamydia: NEGATIVE
Neisseria Gonorrhea: NEGATIVE

## 2017-08-07 LAB — CULTURE, BETA STREP (GROUP B ONLY): Strep Gp B Culture: NEGATIVE

## 2017-08-08 ENCOUNTER — Inpatient Hospital Stay (HOSPITAL_COMMUNITY): Payer: 59 | Admitting: Anesthesiology

## 2017-08-08 ENCOUNTER — Encounter (HOSPITAL_COMMUNITY): Payer: Self-pay | Admitting: Certified Nurse Midwife

## 2017-08-08 ENCOUNTER — Inpatient Hospital Stay (HOSPITAL_COMMUNITY)
Admission: AD | Admit: 2017-08-08 | Discharge: 2017-08-10 | DRG: 774 | Disposition: A | Discharge status: Home / Self Care | Payer: 59 | Source: Ambulatory Visit | Attending: Obstetrics & Gynecology | Admitting: Obstetrics & Gynecology

## 2017-08-08 DIAGNOSIS — O26899 Other specified pregnancy related conditions, unspecified trimester: Secondary | ICD-10-CM

## 2017-08-08 DIAGNOSIS — Z6841 Body Mass Index (BMI) 40.0 and over, adult: Secondary | ICD-10-CM | POA: Diagnosis not present

## 2017-08-08 DIAGNOSIS — O09899 Supervision of other high risk pregnancies, unspecified trimester: Secondary | ICD-10-CM

## 2017-08-08 DIAGNOSIS — O24419 Gestational diabetes mellitus in pregnancy, unspecified control: Secondary | ICD-10-CM | POA: Diagnosis present

## 2017-08-08 DIAGNOSIS — O24420 Gestational diabetes mellitus in childbirth, diet controlled: Secondary | ICD-10-CM | POA: Diagnosis present

## 2017-08-08 DIAGNOSIS — Z6791 Unspecified blood type, Rh negative: Secondary | ICD-10-CM | POA: Diagnosis not present

## 2017-08-08 DIAGNOSIS — Z3A36 36 weeks gestation of pregnancy: Secondary | ICD-10-CM

## 2017-08-08 DIAGNOSIS — O1002 Pre-existing essential hypertension complicating childbirth: Secondary | ICD-10-CM | POA: Diagnosis present

## 2017-08-08 DIAGNOSIS — Z2839 Other underimmunization status: Secondary | ICD-10-CM

## 2017-08-08 DIAGNOSIS — O42013 Preterm premature rupture of membranes, onset of labor within 24 hours of rupture, third trimester: Secondary | ICD-10-CM | POA: Diagnosis present

## 2017-08-08 DIAGNOSIS — O9081 Anemia of the puerperium: Secondary | ICD-10-CM | POA: Diagnosis not present

## 2017-08-08 DIAGNOSIS — O99344 Other mental disorders complicating childbirth: Secondary | ICD-10-CM | POA: Diagnosis present

## 2017-08-08 DIAGNOSIS — O24429 Gestational diabetes mellitus in childbirth, unspecified control: Secondary | ICD-10-CM

## 2017-08-08 DIAGNOSIS — O99214 Obesity complicating childbirth: Secondary | ICD-10-CM | POA: Diagnosis present

## 2017-08-08 DIAGNOSIS — O26893 Other specified pregnancy related conditions, third trimester: Secondary | ICD-10-CM | POA: Diagnosis present

## 2017-08-08 DIAGNOSIS — F329 Major depressive disorder, single episode, unspecified: Secondary | ICD-10-CM | POA: Diagnosis present

## 2017-08-08 DIAGNOSIS — E669 Obesity, unspecified: Secondary | ICD-10-CM | POA: Diagnosis present

## 2017-08-08 DIAGNOSIS — Z283 Underimmunization status: Secondary | ICD-10-CM

## 2017-08-08 DIAGNOSIS — O99340 Other mental disorders complicating pregnancy, unspecified trimester: Secondary | ICD-10-CM

## 2017-08-08 DIAGNOSIS — O0993 Supervision of high risk pregnancy, unspecified, third trimester: Secondary | ICD-10-CM

## 2017-08-08 DIAGNOSIS — O9989 Other specified diseases and conditions complicating pregnancy, childbirth and the puerperium: Secondary | ICD-10-CM

## 2017-08-08 DIAGNOSIS — O42919 Preterm premature rupture of membranes, unspecified as to length of time between rupture and onset of labor, unspecified trimester: Secondary | ICD-10-CM | POA: Diagnosis present

## 2017-08-08 LAB — CBC
HEMATOCRIT: 32.2 % — AB (ref 36.0–46.0)
Hemoglobin: 10.4 g/dL — ABNORMAL LOW (ref 12.0–15.0)
MCH: 27.4 pg (ref 26.0–34.0)
MCHC: 32.3 g/dL (ref 30.0–36.0)
MCV: 84.7 fL (ref 78.0–100.0)
Platelets: 243 10*3/uL (ref 150–400)
RBC: 3.8 MIL/uL — ABNORMAL LOW (ref 3.87–5.11)
RDW: 14.4 % (ref 11.5–15.5)
WBC: 13.9 10*3/uL — ABNORMAL HIGH (ref 4.0–10.5)

## 2017-08-08 LAB — GLUCOSE, CAPILLARY
GLUCOSE-CAPILLARY: 108 mg/dL — AB (ref 65–99)
Glucose-Capillary: 111 mg/dL — ABNORMAL HIGH (ref 65–99)
Glucose-Capillary: 113 mg/dL — ABNORMAL HIGH (ref 65–99)
Glucose-Capillary: 123 mg/dL — ABNORMAL HIGH (ref 65–99)

## 2017-08-08 LAB — POCT FERN TEST: POCT Fern Test: POSITIVE

## 2017-08-08 LAB — ABO/RH: ABO/RH(D): O NEG

## 2017-08-08 LAB — TYPE AND SCREEN
ABO/RH(D): O NEG
Antibody Screen: NEGATIVE

## 2017-08-08 LAB — RPR: RPR Ser Ql: NONREACTIVE

## 2017-08-08 MED ORDER — ONDANSETRON HCL 4 MG/2ML IJ SOLN: 4.0000 mg | Freq: Four times a day (QID) | INTRAMUSCULAR | Status: DC | PRN

## 2017-08-08 MED ORDER — ACETAMINOPHEN 325 MG PO TABS: 650.0000 mg | ORAL_TABLET | ORAL | Status: DC | PRN

## 2017-08-08 MED ORDER — LACTATED RINGERS IV SOLN
500.0000 mL | INTRAVENOUS | Status: DC | PRN
  Administered 2017-08-08: 500 mL via INTRAVENOUS

## 2017-08-08 MED ORDER — OXYCODONE-ACETAMINOPHEN 5-325 MG PO TABS: 1.0000 | ORAL_TABLET | ORAL | Status: DC | PRN

## 2017-08-08 MED ORDER — EPHEDRINE 5 MG/ML INJ
10.0000 mg | INTRAVENOUS | Status: DC | PRN
  Filled 2017-08-08: qty 2

## 2017-08-08 MED ORDER — LACTATED RINGERS IV SOLN: 500.0000 mL | Freq: Once | INTRAVENOUS | Status: DC

## 2017-08-08 MED ORDER — TERBUTALINE SULFATE 1 MG/ML IJ SOLN: 0.2500 mg | Freq: Once | INTRAMUSCULAR | Status: DC | PRN

## 2017-08-08 MED ORDER — FENTANYL 2.5 MCG/ML BUPIVACAINE 1/10 % EPIDURAL INFUSION (WH - ANES)
14.0000 mL/h | INTRAMUSCULAR | Status: DC | PRN
  Administered 2017-08-08 (×2): 14 mL/h via EPIDURAL
  Filled 2017-08-08 (×2): qty 100

## 2017-08-08 MED ORDER — FLEET ENEMA 7-19 GM/118ML RE ENEM: 1.0000 | ENEMA | RECTAL | Status: DC | PRN

## 2017-08-08 MED ORDER — CARBOPROST TROMETHAMINE 250 MCG/ML IM SOLN
INTRAMUSCULAR | Status: AC
  Filled 2017-08-08: qty 1

## 2017-08-08 MED ORDER — FENTANYL 2.5 MCG/ML BUPIVACAINE 1/10 % EPIDURAL INFUSION (WH - ANES): 14.0000 mL/h | INTRAMUSCULAR | Status: DC | PRN

## 2017-08-08 MED ORDER — OXYCODONE-ACETAMINOPHEN 5-325 MG PO TABS: 2.0000 | ORAL_TABLET | ORAL | Status: DC | PRN

## 2017-08-08 MED ORDER — PHENYLEPHRINE 40 MCG/ML (10ML) SYRINGE FOR IV PUSH (FOR BLOOD PRESSURE SUPPORT)
80.0000 ug | PREFILLED_SYRINGE | INTRAVENOUS | Status: DC | PRN
  Filled 2017-08-08: qty 10
  Filled 2017-08-08: qty 5

## 2017-08-08 MED ORDER — DIPHENHYDRAMINE HCL 50 MG/ML IJ SOLN: 12.5000 mg | INTRAMUSCULAR | Status: DC | PRN

## 2017-08-08 MED ORDER — FENTANYL CITRATE (PF) 100 MCG/2ML IJ SOLN
100.0000 ug | INTRAMUSCULAR | Status: DC | PRN
  Administered 2017-08-08: 100 ug via INTRAVENOUS
  Filled 2017-08-08: qty 2

## 2017-08-08 MED ORDER — METHYLERGONOVINE MALEATE 0.2 MG/ML IJ SOLN
0.2000 mg | Freq: Once | INTRAMUSCULAR | Status: AC
  Administered 2017-08-08: 0.2 mg via INTRAMUSCULAR

## 2017-08-08 MED ORDER — OXYTOCIN BOLUS FROM INFUSION
500.0000 mL | Freq: Once | INTRAVENOUS | Status: AC
  Administered 2017-08-08: 500 mL via INTRAVENOUS

## 2017-08-08 MED ORDER — LACTATED RINGERS IV SOLN
INTRAVENOUS | Status: DC
  Administered 2017-08-08 (×3): via INTRAVENOUS

## 2017-08-08 MED ORDER — IBUPROFEN 600 MG PO TABS
600.0000 mg | ORAL_TABLET | Freq: Four times a day (QID) | ORAL | Status: DC
  Administered 2017-08-08 – 2017-08-10 (×7): 600 mg via ORAL
  Filled 2017-08-08 (×7): qty 1

## 2017-08-08 MED ORDER — OXYTOCIN 40 UNITS IN LACTATED RINGERS INFUSION - SIMPLE MED
1.0000 m[IU]/min | INTRAVENOUS | Status: DC
  Administered 2017-08-08: 2 m[IU]/min via INTRAVENOUS

## 2017-08-08 MED ORDER — PHENYLEPHRINE 40 MCG/ML (10ML) SYRINGE FOR IV PUSH (FOR BLOOD PRESSURE SUPPORT)
80.0000 ug | PREFILLED_SYRINGE | INTRAVENOUS | Status: DC | PRN
  Filled 2017-08-08: qty 5

## 2017-08-08 MED ORDER — OXYTOCIN 40 UNITS IN LACTATED RINGERS INFUSION - SIMPLE MED
2.5000 [IU]/h | INTRAVENOUS | Status: DC
  Filled 2017-08-08: qty 1000

## 2017-08-08 MED ORDER — LIDOCAINE HCL (PF) 1 % IJ SOLN
30.0000 mL | INTRAMUSCULAR | Status: DC | PRN
  Filled 2017-08-08: qty 30

## 2017-08-08 MED ORDER — SOD CITRATE-CITRIC ACID 500-334 MG/5ML PO SOLN: 30.0000 mL | ORAL | Status: DC | PRN

## 2017-08-08 NOTE — Progress Notes (Signed)
Labor Progress Note  S: Patient seen & examined for progress of labor. Patient comfortable with epidural.   O: BP 127/76   Pulse 98   Temp 98.4 F (36.9 C) (Oral)   Resp 18   Ht 5\' 2"  (1.575 m)   Wt 115.7 kg (255 lb)   LMP 11/23/2016 (LMP Unknown)   SpO2 99%   BMI 46.64 kg/m   FHT: 130bpm, mod var, +accels, no decels TOCO: q293min, patient looks comfortable during contractions  CVE: Dilation: Lip/rim Effacement (%): 100 Cervical Position: [place] Station: 0 Presentation: Vertex Exam by:: k fields, rn  A&P: 21 y.o. G1P0 5967w6d here for PPROM/SROM on 08/08/17 at 2330.  Currently on no pitocin Continue expectant management Anticipate SVD soon  Lezlie OctaveAmanda Charl Wellen, MD [facility] Resident PGY-1 08/08/2017 12:38 PM

## 2017-08-08 NOTE — Anesthesia Pain Management Evaluation Note (Signed)
  CRNA Pain Management Visit Note  Patient: Olivia Simmons, 21 y.o., female  "Hello [facility] am [facility]. We have an anesthesia team available at all times to provide care throughout the hospital, including epidural management and anesthesia for C-section. I don't know your plan for the delivery whether it a natural birth, water birth, IV sedation, nitrous supplementation, doula or epidural, but we want to meet your pain goals."   1.Was your pain managed to your expectations on prior hospitalizations?   No prior hospitalizations  2.What is your expectation for pain management during this hospitalization?     Epidural  3.How can we help you reach that goal? epidural  Record the patient's initial score and the patient's pain goal.   Pain: 0  Pain Goal: 4 [facility] wants you to be able to say your pain was always managed very well.  Marguarite Markov 08/08/2017

## 2017-08-08 NOTE — Progress Notes (Signed)
Bedside US: Vertex Heather Hogan 2:18 AM 08/08/17  

## 2017-08-08 NOTE — MAU Note (Addendum)
SROM at 2330. Some abd cramping. GDM diet control. Pt states not well controlled. Sugars average 200s

## 2017-08-08 NOTE — Anesthesia Preprocedure Evaluation (Signed)
Anesthesia Evaluation  Patient identified by MRN, date of birth, ID band Patient awake    Reviewed: Allergy & Precautions, NPO status , Patient's Chart, lab work & pertinent test results  Airway Mallampati: III  TM Distance: >3 FB Neck ROM: Full    Dental no notable dental hx.    Pulmonary neg pulmonary ROS,    Pulmonary exam normal breath sounds clear to auscultation       Cardiovascular hypertension, Normal cardiovascular exam Rhythm:Regular Rate:Normal     Neuro/Psych PSYCHIATRIC DISORDERS Depression negative neurological ROS     GI/Hepatic negative GI ROS, Neg liver ROS,   Endo/Other  diabetesMorbid obesity  Renal/GU negative Renal ROS  negative genitourinary   Musculoskeletal negative musculoskeletal ROS (+)   Abdominal   Peds negative pediatric ROS (+)  Hematology negative hematology ROS (+)   Anesthesia Other Findings   Reproductive/Obstetrics negative OB ROS                             Anesthesia Physical Anesthesia Plan  ASA: III  Anesthesia Plan: Epidural   Post-op Pain Management:    Induction:   PONV Risk Score and Plan:   Airway Management Planned:   Additional Equipment:   Intra-op Plan:   Post-operative Plan:   Informed Consent: I have reviewed the patients History and Physical, chart, labs and discussed the procedure including the risks, benefits and alternatives for the proposed anesthesia with the patient or authorized representative who has indicated his/her understanding and acceptance.     Plan Discussed with:   Anesthesia Plan Comments:         Anesthesia Quick Evaluation

## 2017-08-08 NOTE — H&P (Signed)
LABOR ADMISSION HISTORY AND PHYSICAL  Olivia Simmons is a 21 y.o. female G68P0 with IUP at 47w6dby LMP presenting for PPROM/SROM on 08/08/17 @ 2330. She reports +FMs, no VB, no blurry vision, no headaches, + lower extremity edema, and no RUQ pain.  She plans on breast and bottle feeding. She is currently undecided on birth control, discussed long term options like IUD and nexplanon.  Dating: By LMP --->  Estimated Date of Delivery: 08/30/17  Sono:   '@[redacted]w[redacted]d'$ , CWD, normal anatomy, vertex presentation, 343g, 64% EFW, posterior placenta  Prenatal History/Complications: [place] Depression Rh negative Rubella Non-Immune  Past Medical History: Past Medical History:  Diagnosis Date  . ADHD   . Chronic hypertension affecting pregnancy 02/24/2017    Past Surgical History: Past Surgical History:  Procedure Laterality Date  . NO PAST SURGERIES      Obstetrical History: OB History    Gravida Para Term Preterm AB Living   1             SAB TAB Ectopic Multiple Live Births                  Social History: Social History   Social History  . Marital status: Single    Spouse name: N/A  . Number of children: N/A  . Years of education: N/A   Social History Main Topics  . Smoking status: Never Smoker  . Smokeless tobacco: Never Used  . Alcohol use No  . Drug use: No     Comment: none with pregnancy  . Sexual activity: Yes   Other Topics Concern  . None   Social History Narrative  . None    Family History: Family History  Problem Relation Age of Onset  . Hypertension Mother   . Heart disease Father     Allergies: No Known Allergies  Prescriptions Prior to Admission  Medication Sig Dispense Refill Last Dose  . ACCU-CHEK FASTCLIX LANCETS MISC 1 each by Percutaneous route 4 (four) times daily. (Patient not taking: Reported on 08/03/2017) 100 each 12 Not Taking  . Blood Glucose Monitoring Suppl (ACCU-CHEK GUIDE) w/Device KIT 1 kit by Does not apply route  4 (four) times daily. (Patient not taking: Reported on 08/03/2017) 1 kit 0 Not Taking  . glucose blood test strip Use as instructed to check blood sugar 4 times daily (Patient not taking: Reported on 08/03/2017) 100 each 12 Not Taking  . glycopyrrolate (ROBINUL) 2 MG tablet TK 1 T PO TID PRN  1 Not Taking  . Prenat w/o A Vit-FeFum-FePo-FA (CONCEPT OB) 130-92.4-1 MG CAPS Take 1 capsule by mouth daily. (Patient not taking: Reported on 08/03/2017) 30 capsule 11 Not Taking  . promethazine (PHENERGAN) 12.5 MG tablet TK 1 T PO Q 6 H PRN N  1 Not Taking     Review of Systems   All systems reviewed and negative except as stated in HPI  Blood pressure 140/73, pulse (!) 114, temperature 98.6 F (37 C), resp. rate 20, height '5\' 2"'$  (1.575 m), weight 115.7 kg (255 lb), last menstrual period 11/23/2016. General appearance: alert, cooperative and no distress Lungs: normal work of breathing Extremities: Homans sign is negative, no sign of DVT Presentation: cephalic Fetal monitoringBaseline: 142 bpm, Variability: Good {> 6 bpm), Accelerations: Reactive and Decelerations: Absent Uterine activityFrequency: Every 6-8 minutes     Prenatal labs: ABO, Rh: O/Negative/-- (03/01 0000) Antibody: Negative (03/01 0000) Rubella: non- immune RPR: Non Reactive (06/25 0937)  HBsAg: Negative (  03/01 0000)  HIV:   non reactive (6/25 0122) GBS:   negative (08/28 1627) GTT: Fasting- 99, 1 hr- 201, 2 hr- 187  Prenatal Transfer Tool  Maternal Diabetes: Yes:  Diabetes Type:  Diet controlled Genetic Screening: Normal Maternal Ultrasounds/Referrals: Normal Fetal Ultrasounds or other Referrals:  None Maternal Substance Abuse:  No Significant Maternal Medications:  None Significant Maternal Lab Results: None  Results for orders placed or performed during the hospital encounter of 08/08/17 (from the past 24 hour(s))  Fern Test   Collection Time: 08/08/17  1:06 AM  Result Value Ref Range   POCT Fern Test Positive =  ruptured amniotic membanes     Patient Active Problem List   Diagnosis Date Noted  . GDM (gestational diabetes mellitus) 08/03/2017  . Rubella non-immune status, antepartum 03/15/2017  . Rh negative state in antepartum period 02/25/2017  . Obesity, Class II, BMI 35-39.9 02/25/2017  . Obesity in pregnancy 02/25/2017  . Depression affecting pregnancy 02/24/2017  . Supervision of high-risk pregnancy 02/24/2017    Assessment: Olivia Simmons is a 21 y.o. G1P0 at 20w6dhere for SOL, SROM on 08/08/17 @ 2330.  #Labor: expectant management, SVE and may start cytotec depending on exam, limit checks with membrane rupture #Pain: May want to use NO gas, Epidural upon request  #FWB: Cat I #ID: [id number] #MOF: breast and bottle #MOC:undecided - IUD and nexplanon discussed  #Circ:  Yes, inpatient  A1GDM - blood glucose @ 0324 is 111, continue to monitor  NSpringStudent 08/08/2017, 2:36 AM  OB [place]organ [place] I confirm that I have verified the information documented in the medical student's note and that I have also personally performed the physical exam and all medical decision making activities. - PPROM at 346w6dContracting regularly. SVE 3/90/-3. Expectant management for now. Recheck in 2 hours and augment with Pit if needed. - A1GDM - monitor CBG q4 h for now, q2h when in active labor  JuGailen ShelterMD OB Fellow 08/08/2017, 4:38 AM

## 2017-08-08 NOTE — Anesthesia Procedure Notes (Signed)
Epidural Patient location during procedure: OB  Staffing Anesthesiologist: ODDONO, ERNEST Performed: anesthesiologist   Preanesthetic Checklist Completed: patient identified, pre-op evaluation, timeout performed, IV checked, risks and benefits discussed and monitors and equipment checked  Epidural Patient position: sitting Prep: site prepped and draped and DuraPrep Patient monitoring: heart rate Approach: midline Location: L3-L4 Injection technique: LOR air and LOR saline  Needle:  Needle type: Tuohy  Needle gauge: 17 G Needle length: 9 cm Needle insertion depth: 9 cm Catheter type: closed end flexible Catheter size: 19 Gauge Catheter at skin depth: 14 cm Test dose: negative  Assessment Sensory level: T8 Events: blood not aspirated, injection not painful, no injection resistance, negative IV test and no paresthesia  Additional Notes Reason for block:procedure for pain

## 2017-08-09 DIAGNOSIS — O9081 Anemia of the puerperium: Secondary | ICD-10-CM | POA: Diagnosis present

## 2017-08-09 LAB — CBC
HCT: 24.6 % — ABNORMAL LOW (ref 36.0–46.0)
Hemoglobin: 8 g/dL — ABNORMAL LOW (ref 12.0–15.0)
MCH: 27.8 pg (ref 26.0–34.0)
MCHC: 32.5 g/dL (ref 30.0–36.0)
MCV: 85.4 fL (ref 78.0–100.0)
PLATELETS: 209 10*3/uL (ref 150–400)
RBC: 2.88 MIL/uL — ABNORMAL LOW (ref 3.87–5.11)
RDW: 14.1 % (ref 11.5–15.5)
WBC: 22.1 10*3/uL — AB (ref 4.0–10.5)

## 2017-08-09 MED ORDER — ONDANSETRON HCL 4 MG PO TABS: 4.0000 mg | ORAL_TABLET | ORAL | Status: DC | PRN

## 2017-08-09 MED ORDER — DOCUSATE SODIUM 100 MG PO CAPS
100.0000 mg | ORAL_CAPSULE | Freq: Three times a day (TID) | ORAL | Status: DC
  Administered 2017-08-09 – 2017-08-10 (×4): 100 mg via ORAL
  Filled 2017-08-09 (×4): qty 1

## 2017-08-09 MED ORDER — SIMETHICONE 80 MG PO CHEW: 80.0000 mg | CHEWABLE_TABLET | ORAL | Status: DC | PRN

## 2017-08-09 MED ORDER — MEASLES, MUMPS & RUBELLA VAC ~~LOC~~ INJ
0.5000 mL | INJECTION | Freq: Once | SUBCUTANEOUS | Status: DC
  Filled 2017-08-09: qty 0.5

## 2017-08-09 MED ORDER — SODIUM CHLORIDE 0.9% FLUSH: 3.0000 mL | Freq: Two times a day (BID) | INTRAVENOUS | Status: DC

## 2017-08-09 MED ORDER — ZOLPIDEM TARTRATE 5 MG PO TABS: 5.0000 mg | ORAL_TABLET | Freq: Every evening | ORAL | Status: DC | PRN

## 2017-08-09 MED ORDER — SENNOSIDES-DOCUSATE SODIUM 8.6-50 MG PO TABS
2.0000 | ORAL_TABLET | ORAL | Status: DC
  Administered 2017-08-09: 2 via ORAL
  Filled 2017-08-09: qty 2

## 2017-08-09 MED ORDER — WITCH HAZEL-GLYCERIN EX PADS: 1.0000 "application " | MEDICATED_PAD | CUTANEOUS | Status: DC | PRN

## 2017-08-09 MED ORDER — SODIUM CHLORIDE 0.9 % IV SOLN: 510.0000 mg | Freq: Once | INTRAVENOUS | Status: DC

## 2017-08-09 MED ORDER — DIBUCAINE 1 % RE OINT: 1.0000 "application " | TOPICAL_OINTMENT | RECTAL | Status: DC | PRN

## 2017-08-09 MED ORDER — ACETAMINOPHEN 325 MG PO TABS: 650.0000 mg | ORAL_TABLET | ORAL | Status: DC | PRN

## 2017-08-09 MED ORDER — BENZOCAINE-MENTHOL 20-0.5 % EX AERO
1.0000 "application " | INHALATION_SPRAY | CUTANEOUS | Status: DC | PRN
  Filled 2017-08-09: qty 56

## 2017-08-09 MED ORDER — OXYCODONE HCL 5 MG PO TABS
5.0000 mg | ORAL_TABLET | ORAL | Status: DC | PRN
  Administered 2017-08-09: 5 mg via ORAL
  Filled 2017-08-09: qty 1

## 2017-08-09 MED ORDER — MEASLES, MUMPS & RUBELLA VAC ~~LOC~~ INJ
0.5000 mL | INJECTION | Freq: Once | SUBCUTANEOUS | Status: AC
Start: 2017-08-10 — End: 2017-08-10
  Administered 2017-08-10: 0.5 mL via SUBCUTANEOUS
  Filled 2017-08-09 (×2): qty 0.5

## 2017-08-09 MED ORDER — ONDANSETRON HCL 4 MG/2ML IJ SOLN: 4.0000 mg | INTRAMUSCULAR | Status: DC | PRN

## 2017-08-09 MED ORDER — PRENATAL MULTIVITAMIN CH
1.0000 | ORAL_TABLET | Freq: Every day | ORAL | Status: DC
  Administered 2017-08-09 – 2017-08-10 (×2): 1 via ORAL
  Filled 2017-08-09 (×2): qty 1

## 2017-08-09 MED ORDER — TETANUS-DIPHTH-ACELL PERTUSSIS 5-2.5-18.5 LF-MCG/0.5 IM SUSP: 0.5000 mL | Freq: Once | INTRAMUSCULAR | Status: DC

## 2017-08-09 MED ORDER — COCONUT OIL OIL: 1.0000 "application " | TOPICAL_OIL | Status: DC | PRN

## 2017-08-09 MED ORDER — SODIUM CHLORIDE 0.9 % IV SOLN
510.0000 mg | Freq: Once | INTRAVENOUS | Status: AC
  Administered 2017-08-10: 510 mg via INTRAVENOUS
  Filled 2017-08-09 (×2): qty 17

## 2017-08-09 MED ORDER — SODIUM CHLORIDE 0.9% FLUSH: 3.0000 mL | INTRAVENOUS | Status: DC | PRN

## 2017-08-09 MED ORDER — DIPHENHYDRAMINE HCL 25 MG PO CAPS: 25.0000 mg | ORAL_CAPSULE | Freq: Four times a day (QID) | ORAL | Status: DC | PRN

## 2017-08-09 MED ORDER — RHO D IMMUNE GLOBULIN 1500 UNIT/2ML IJ SOSY
300.0000 ug | PREFILLED_SYRINGE | Freq: Once | INTRAMUSCULAR | Status: AC
  Administered 2017-08-09: 300 ug via INTRAMUSCULAR
  Filled 2017-08-09: qty 2

## 2017-08-09 MED ORDER — SODIUM CHLORIDE 0.9 % IV SOLN: 250.0000 mL | INTRAVENOUS | Status: DC | PRN

## 2017-08-09 MED ORDER — OXYCODONE HCL 5 MG PO TABS
10.0000 mg | ORAL_TABLET | ORAL | Status: DC | PRN
Start: 2017-08-09 — End: 2017-08-10

## 2017-08-09 NOTE — Progress Notes (Signed)
Post Partum Day 1 s/p VAVD complicated by shoulder dystocia and 4th degree laceration  Subjective: No complaints, up ad lib, voiding and tolerating PO. Worried about BM given her 4th degree laceration.  Objective: Blood pressure 115/64, pulse (!) 102, temperature 99 F (37.2 C), temperature source Oral, resp. rate 18, height 5\' 2"  (1.575 m), weight 255 lb (115.7 kg), last menstrual period 11/23/2016, SpO2 100 %.  Physical Exam:  General: alert and no distress Lochia: appropriate Uterine Fundus: firm DVT Evaluation: No evidence of DVT seen on physical exam. Negative Homan's sign.   Recent Labs  08/08/17 0302 08/09/17 0808  HGB 10.4* 8.0*  HCT 32.2* 24.6*    Assessment/Plan: Breastfeeding  Feraheme ordered for postpartum anemia; want to avoid oral iron as to reduce risk of constipation Continue stool softeners and oral fluids to avoid constipation Pain medication as needed Continue routine postpartum care    LOS: 1 day   Jaynie CollinsUgonna Josette Shimabukuro, MD 08/09/2017, 9:52 AM

## 2017-08-09 NOTE — Anesthesia Postprocedure Evaluation (Signed)
Anesthesia Post Note  Patient: Olivia Simmons  Procedure(s) Performed: * No procedures listed *     Patient location during evaluation: [facility] Anesthesia Type: Epidural Level of consciousness: awake and alert and oriented Pain management: pain level controlled Vital Signs Assessment: post-procedure vital signs reviewed and stable Respiratory status: spontaneous breathing and nonlabored ventilation Cardiovascular status: stable Postop Assessment: no headache, no backache, no signs of nausea or vomiting, patient able to bend at knees, epidural receding and adequate PO intake Anesthetic complications: no    Last Vitals:  Vitals:   08/09/17 0043 08/09/17 0442  BP: 122/62 118/60  Pulse: (!) 107 99  Resp: 18 18  Temp: 36.9 C 36.7 C  SpO2: 100% 99%    Last Pain:  Vitals:   08/09/17 0800  TempSrc:   PainSc: 0-No pain   Pain Goal: Patients Stated Pain Goal: 2 (08/09/17 0800)               Laban EmperorMalinova,Mathilda Maguire Hristova

## 2017-08-09 NOTE — Lactation Note (Signed)
This note was copied from a baby's chart. Lactation Consultation Note  Patient Name: Olivia Simmons ZOXWR'UToday's Date: 08/09/2017 Reason for consult: Initial assessment;Late-preterm 34-36.6wks;NICU baby;Primapara;1st time breastfeeding   Initial consult with mom of NICU baby; GA 36.6; BW 9 lbs, 7.3 oz; Mom is a P1; HTN, GDM, Shoulder dystocia and code apgar at birth.   Rn stated mom did not want to pump yesterday after birth or during the night.  RN got mom to pump for first time this morning.  LC reviewed NICU booklet, started pumping log, reviewed transport of milk, gave yellow dots, gave extra colostrum containers, and curved tip syringe. Encouraged mom to pump every 2 hrs during the day and at least once during the night for a total of 8+ pumpings per day. Observed mom's second pumping using #24 flanges which are the correct fit at this time.   Mom has very large soft breast with everted nipples.   Taught mom how to hand express with colostrum glistening tip of nipple.   Mom stated [place] [facility] but will need recertifying with WIC.  WIC referral form faxed today at [phone].  Potential discharge for tomorrow.     Maternal Data Formula Feeding for Exclusion: Yes Reason for exclusion: Admission to Intensive Care Unit (ICU) post-partum Has patient been taught Hand Expression?: Yes (with return demonstration, colostrum glistening tip of nipple) Does the patient have breastfeeding experience prior to this delivery?: No  Feeding    LATCH Score       Type of Nipple: Everted at rest and after stimulation  Comfort (Breast/Nipple): Soft / non-tender        Interventions    Lactation Tools Discussed/Used WIC Program: Yes Initiated by:: RN Date initiated:: 08/09/17   Consult Status Consult Status: Follow-up Date: 08/10/17 Follow-up type: In-patient    Olivia Simmons, Olivia Simmons 08/09/2017, 1:02 PM

## 2017-08-10 LAB — RH IG WORKUP (INCLUDES ABO/RH)
ABO/RH(D): O NEG
FETAL SCREEN: NEGATIVE
Gestational Age(Wks): 37
Unit division: 0

## 2017-08-10 LAB — GLUCOSE, CAPILLARY: Glucose-Capillary: 83 mg/dL (ref 65–99)

## 2017-08-10 MED ORDER — OXYCODONE HCL 5 MG PO TABS: 5.0000 mg | ORAL_TABLET | ORAL | 0 refills | Status: AC | PRN

## 2017-08-10 MED ORDER — IBUPROFEN 600 MG PO TABS: 600.0000 mg | ORAL_TABLET | Freq: Four times a day (QID) | ORAL | 1 refills | Status: AC

## 2017-08-10 MED ORDER — ACETAMINOPHEN 325 MG PO TABS: 650.0000 mg | ORAL_TABLET | ORAL | 3 refills | Status: AC | PRN

## 2017-08-10 MED ORDER — DOCUSATE SODIUM 100 MG PO CAPS: 100.0000 mg | ORAL_CAPSULE | Freq: Three times a day (TID) | ORAL | 1 refills | Status: AC

## 2017-08-10 NOTE — Discharge Instructions (Signed)
Postpartum Care After Vaginal Delivery °The period of time right after you deliver your newborn is called the postpartum period. °What kind of medical care will I receive? °· You may continue to receive fluids and medicines through an IV tube inserted into one of your veins. °· If an incision was made near your vagina (episiotomy) or if you had some vaginal tearing during delivery, cold compresses may be placed on your episiotomy or your tear. This helps to reduce pain and swelling. °· You may be given a squirt bottle to use when you go to the bathroom. You may use this until you are comfortable wiping as usual. To use the squirt bottle, follow these steps: °? Before you urinate, fill the squirt bottle with warm water. Do not use hot water. °? After you urinate, while you are sitting on the toilet, use the squirt bottle to rinse the area around your urethra and vaginal opening. This rinses away any urine and blood. °? You may do this instead of wiping. As you start healing, you may use the squirt bottle before wiping yourself. Make sure to wipe gently. °? Fill the squirt bottle with clean water every time you use the bathroom. °· You will be given sanitary pads to wear. °How can I expect to feel? °· You may not feel the need to urinate for several hours after delivery. °· You will have some soreness and pain in your abdomen and vagina. °· If you are breastfeeding, you may have uterine contractions every time you breastfeed for up to several weeks postpartum. Uterine contractions help your uterus return to its normal size. °· It is normal to have vaginal bleeding (lochia) after delivery. The amount and appearance of lochia is often similar to a menstrual period in the first week after delivery. It will gradually decrease over the next few weeks to a dry, yellow-brown discharge. For most women, lochia stops completely by 6-8 weeks after delivery. Vaginal bleeding can vary from woman to woman. °· Within the first few  days after delivery, you may have breast engorgement. This is when your breasts feel heavy, full, and uncomfortable. Your breasts may also throb and feel hard, tightly stretched, warm, and tender. After this occurs, you may have milk leaking from your breasts. Your health care provider can help you relieve discomfort due to breast engorgement. Breast engorgement should go away within a few days. °· You may feel more sad or worried than normal due to hormonal changes after delivery. These feelings should not last more than a few days. If these feelings do not go away after several days, speak with your health care provider. °How should I care for myself? °· Tell your health care provider if you have pain or discomfort. °· Drink enough water to keep your urine clear or pale yellow. °· Wash your hands thoroughly with soap and water for at least 20 seconds after changing your sanitary pads, after using the toilet, and before holding or feeding your baby. °· If you are not breastfeeding, avoid touching your breasts a lot. Doing this can make your breasts produce more milk. °· If you become weak or lightheaded, or you feel like you might faint, ask for help before: °? Getting out of bed. °? Showering. °· Change your sanitary pads frequently. Watch for any changes in your flow, such as a sudden increase in volume, a change in color, the passing of large blood clots. If you pass a blood clot from your vagina, save it   to show to your health care provider. Do not flush blood clots down the toilet without having your health care provider look at them.  Make sure that all your vaccinations are up to date. This can help protect you and your baby from getting certain diseases. You may need to have immunizations done before you leave the hospital.  If desired, talk with your health care provider about methods of family planning or birth control (contraception). How can I start bonding with my baby? Spending as much time as  possible with your baby is very important. During this time, you and your baby can get to know each other and develop a bond. Having your baby stay with you in your room (rooming in) can give you time to get to know your baby. Rooming in can also help you become comfortable caring for your baby. Breastfeeding can also help you bond with your baby. How can I plan for returning home with my baby?  Make sure that you have a car seat installed in your vehicle. ? Your car seat should be checked by a certified car seat installer to make sure that it is installed safely. ? Make sure that your baby fits into the car seat safely.  Ask your health care provider any questions you have about caring for yourself or your baby. Make sure that you are able to contact your health care provider with any questions after leaving the hospital. This information is not intended to replace advice given to you by your health care provider. Make sure you discuss any questions you have with your health care provider. Document Released: 09/20/2007 Document Revised: 04/27/2016 Document Reviewed: 10/28/2015 Elsevier Interactive Patient Education  2018 [employer]   Vaginal Laceration A vaginal laceration is a cut or tear of the opening of your vagina, the inside of the vaginal canal, or the skin between your vaginal opening and your anus (perineum). What are the causes? This condition may be caused by:  Childbirth, or tools used to help deliver a baby, such as forceps.  Sex.  An injury from sports, bike riding, or other activities.  Thinning, dryness, or irritation of the vagina due to low estrogen levels (vulvovaginal atrophy).  What increases the risk? This condition is more likely to occur in women who:  Give birth vaginally.  Are sexually active.  Have gone through menopause.  Have low estrogen levels due to certain medicines, breast cancer treatments, or breastfeeding.  What are the signs or  symptoms? Symptoms of this condition include:  Slight to heavy vaginal bleeding.  Vaginal swelling.  Mild to severe pain.  Vaginal tenderness.  Painful urination.  Pain or discomfort during sex.  How is this diagnosed? If the tear happened during childbirth, your health care provider can diagnose the tear at that time. Your health care provider can diagnose other tears with a medical history and physical exam. Other tests may be done, including:  Blood tests to check your hormone levels and blood loss.  Imaging tests, such as an ultrasonogram or CT scan, to rule out other health issues, such as enlarged lymph nodes or tumors.  How is this treated? Treatment depends on the severity of the tear. Minor tears may heal on their own. Other treatment may include:  Stitches (sutures).  Medicines, such as: ? Creams to reduce pain. ? Vaginal lubricants to treat vaginal dryness. ? Topical or oral hormonal therapy. ? Antibiotics. These may be taken orally or given as ointments to prevent  or treat infection.  Surgery may be needed if the tear is severe. Follow these instructions at home: Wound care  Follow instructions from your health care provider about how to take care of your tear. Make sure you: ? Keep the area clean. ? Leave sutures in place, if this applies. These skin closures may need to stay in place for 2 weeks or longer.  If directed, apply ice to the injured area: ? Put ice in a plastic bag. ? Place a towel between your skin and the bag. ? Leave the ice on for 20 minutes, 2-3 times per day.  Check your wound every day for signs of infection. Check for: ? More redness, swelling, or pain. ? More fluid or blood. ? Warmth. ? Pus or a bad smell. Medicines  Take or apply over-the-counter and prescription medicines only as told by your health care provider.  If you were prescribed an antibiotic, take or use it only as told by your health care provider. Do not stop  taking or using the antibiotic even if you start to feel better. General instructions  Take a sitz bath 2-3 times a day or as told by your health care provider. A sitz bath is a shallow, warm water bath that is taken while you are sitting down. The water should only come up to your hips and should cover your buttocks.  Avoid sitting or standing for long periods of time.  Lie on your side while sleeping or resting.  Avoid straining during bowel movements. Ask your health care provider if a stool softener would help.  Do not douche, use a tampon, or have sex until your health care provider approves.  Keep all follow-up visits as told by your health care provider. This is important. Contact a health care provider if:  You have more redness, swelling, or pain in the vaginal area.  You have more fluid or blood coming from your vaginal tear.  You have pus or a bad smell coming from your vaginal area.  You have a fever.  Your tear breaks open after it healed or was repaired.  You continue to have pain during sex after the tear heals.  You have a burning pain when you urinate.  You are urinating more often than usual or feel an increased urgency to urinate. Get help right away if:  You feel lightheaded.  You have nausea or vomiting.  You have severe pain around your vagina, or in your pelvis or lower belly.  You have heavy vaginal bleeding or you are soaking more than 1 pad per hour. This information is not intended to replace advice given to you by your health care provider. Make sure you discuss any questions you have with your health care provider. Document Released: 11/23/2005 Document Revised: 11/06/2015 Document Reviewed: 09/18/2015 Elsevier Interactive Patient Education  [employer].

## 2017-08-10 NOTE — Discharge Summary (Signed)
OB Discharge Summary     Patient Name: Olivia Simmons DOB: Feb 07, 1996 MRN: [id number]  Date of admission: 08/08/2017 Delivering MD: Verita Schneiders A   Date of discharge: 08/10/2017  Admitting diagnosis: 36 WKS WATER BROKE Intrauterine pregnancy: [redacted]w[redacted]d    Secondary diagnosis:  Principal Problem:   Preterm premature rupture of membranes Active Problems:   Rh negative state in antepartum period   Obesity, Class II, BMI 35-39.9   Rubella non-immune status, antepartum   GDM (gestational diabetes mellitus)   Postpartum anemia  Additional problems: Shoulder dystocia     Discharge diagnosis: Term Pregnancy Delivered and shoulder dystocia                                   Hospital course:  Onset of Labor With Vaginal Delivery     21y.o. yo G1P0 at 331w1das admitted in Latent Labor on 08/08/2017. Patient had an uncomplicated labor course as follows:  Membrane Rupture Time/Date: 11:30 PM ,08/07/2017   Intrapartum Procedures: Episiotomy: None [1]                                         Lacerations:  4th degree [5]  Patient had a delivery of a Viable infant. 08/08/2017  Information for the patient's newborn:  BaShayla, Heming0[[id number]]Delivery Method: Vaginal, Vacuum (Extractor) (Filed from Delivery Summary) Complicated by a 96m37m40 sec shoulder dystocia. See delivery note for details  Pateint had an uncomplicated postpartum course.  She is ambulating, tolerating a regular diet, passing flatus, and urinating well. Patient is discharged home in stable condition on 08/10/17.   Physical exam  Vitals:   08/09/17 1600 08/09/17 1917 08/10/17 0700 08/10/17 0821  BP: (!) 98/45 (!) 107/55  (!) 111/53  Pulse: 97 100  80  Resp: _0 Temp: 98 F (36.7 C) 98.5 F (36.9 C) 98.2 F (36.8 C) 98.2 F (36.8 C)  TempSrc: Oral Oral Oral Oral  SpO2: 100% 99% 100% 100%  Weight:      Height:       General: alert, cooperative and no distress Lochia: appropriate Uterine Fundus:  firm DVT Evaluation: No evidence of DVT seen on physical exam. Labs: Lab Results  Component Value Date   WBC 22.1 (H) 08/09/2017   HGB 8.0 (L) 08/09/2017   HCT 24.6 (L) 08/09/2017   MCV 85.4 08/09/2017   PLT 209 08/09/2017   CMP Latest Ref Rng & Units 12/31/2016  Glucose 65 - 99 mg/dL 87  BUN 6 - 20 mg/dL 7  Creatinine 0.44 - 1.00 mg/dL 0.76  Sodium 135 - 145 mmol/L 132(L)  Potassium 3.5 - 5.1 mmol/L 3.9  Chloride 101 - 111 mmol/L 104  CO2 22 - 32 mmol/L 21(L)  Calcium 8.9 - 10.3 mg/dL 9.3  Total Protein 6.5 - 8.1 g/dL 6.8  Total Bilirubin 0.3 - 1.2 mg/dL 0.8  Alkaline Phos 38 - 126 U/L 86  AST 15 - 41 U/L 13(L)  ALT 14 - 54 U/L 17    Discharge instruction: per After Visit Summary and "Baby and Me Booklet".  After visit meds:  Allergies as of 08/10/2017   No Known Allergies     Medication List    STOP taking these medications   ACCU-CHEK FASTCLIX LANCETS Misc  ACCU-CHEK GUIDE w/Device Kit   glucose blood test strip     TAKE these medications   acetaminophen 325 MG tablet Commonly known as:  TYLENOL Take 2 tablets (650 mg total) by mouth every 4 (four) hours as needed (for pain scale < 4).   docusate sodium 100 MG capsule Commonly known as:  COLACE Take 1 capsule (100 mg total) by mouth 3 (three) times daily.   ibuprofen 600 MG tablet Commonly known as:  ADVIL,MOTRIN Take 1 tablet (600 mg total) by mouth every 6 (six) hours.   oxyCODONE 5 MG immediate release tablet Commonly known as:  Oxy IR/ROXICODONE Take 1 tablet (5 mg total) by mouth every 4 (four) hours as needed (pain scale 4-7).            Discharge Care Instructions        Start     Ordered   08/10/17 0000  acetaminophen (TYLENOL) 325 MG tablet  Every 4 hours PRN     08/10/17 0812   08/10/17 0000  docusate sodium (COLACE) 100 MG capsule  3 times daily     08/10/17 0812   08/10/17 0000  ibuprofen (ADVIL,MOTRIN) 600 MG tablet  Every 6 hours     08/10/17 0812   08/10/17 0000  oxyCODONE  (OXY IR/ROXICODONE) 5 MG immediate release tablet  Every 4 hours PRN     08/10/17 0812      Diet: routine diet  Activity: Advance as tolerated. Pelvic rest for 6 weeks.   Follow up Appt: Future Appointments Date Time Provider Department Center  08/11/2017 3:45 PM [facility] US 5 [facility]  08/12/2017 10:00 AM Arnold, James G, MD [facility]   Follow up Visit:No Follow-up on file.  Postpartum contraception: Abstinence  Newborn Data: Live born female  Birth Weight: 9 lb 7.3 oz (4290 g) APGAR: 0, 3  Baby Feeding: Breast Disposition:NICU   08/10/2017 Peggy Constant, MD   

## 2017-08-10 NOTE — Lactation Note (Signed)
This note was copied from a baby's chart. Lactation Consultation Note  Patient Name: Olivia Sonnie AlamoLindsay Satcher ZOXWR'UToday's Date: 08/10/2017   [facility]  Recommend mother pump q 3 hours with the exception of once during the night. Answered questions and encouraged mother.      Maternal Data    Feeding    LATCH Score                   Interventions    Lactation Tools Discussed/Used     Consult Status      Hardie PulleyBerkelhammer, Ruth Boschen 08/10/2017, 12:54 PM

## 2017-08-10 NOTE — Care Management Note (Signed)
Case Management Note  Patient Details  Name: Olivia Simmons MRN: [id number] Date of Birth: 01/20/1996  Additional Comments: 08/10/17  400p- Case Manager notified by Pt's Nurse that Pt has a discharge from this am but continues to find reason's to stay in the hospital.  The MD had not mentioned any dc needs at discharge.  Pt told Nurse that technically she could stay in the hospital room (310) until midnight.  Pt stated that she could not wear the clothes home that she was admitted in as her water broke and the clothes are still wet.  Pt's Mother left earlier today and stated that she would bring her some clothes back to the hospital.  The Pt and her Grandmother can not reach the Pt's Mother via cell phone as she will not answer and they are unsure of when she will return.  Pt lives with her Mother. Pt's Nurse wanted to know if CM could assist with discharge.   CM spoke with Pt's Nurse and ChiropodistAssistant Director about discharge plan.   CM spoke with Pt and her family - FOB and Pt's Grandmother - Nurse present for entire conversation. Infant is in NICU and doing ok per the family.  CM spoke about the dc that was written by the MD this am and there are no dc needs noted - Pt in agreement.  Discussed issues regarding need for Pt to stay in her room until midnight.  Pt and Grandmother stated that the Pt's Mother has not returned with the Pt's clothes and she has nothing to wear home.  The Pt's Nurse stated they do not have paper scrubs in the Pt's size - XL.  CM questioned the Pt about wearing the hospital gown home - she was fine with that as well as the Grandmother.  CM stated that she could bring the gown back tomorrow/or next time she visited the infant and call the CM - CM gave her hospital cell number - to come to the NICU and obtain the gown. - Pt in agreement with that plan.  Pt will stay with the Grandmother tonight and maybe a couple of nights until the Pt and FOB obtain their own place.  Pt and  Grandmother stated that Pt's Mother was unreliable and a stressor for the Pt. Pt's Nurse will give the Pt a cart to put her things on to carry to the car - Grandmother's brother's car.  Grandmother and husband only have 1 car so Pt will need bus pass.  Grandmother lives in [place] - will need [place] and [place] bus pass.  CM spoke with CSW C.Clelia CroftShaw and obtained those passes and given to the Pt.  Pt can speak with CSW tomorrow between 10a and 3p for additional passes.  Pt voiced understanding.  Family in process of loading cart with Pt's belongings, Pt and family aware that they can certainly visit the infant prior to leaving the hospital today - voiced understanding.  Nurse aware.  ChiropodistAssistant Director informed of above.  Pt should be leaving the hospital room shortly. Pt and family had no further questions for  CM.  CM available to assist as needed.    Roseanne RenoJohnson, Olivia Simmons, [place]   [phone] 08/10/2017, 4:51 PM

## 2017-08-10 NOTE — Progress Notes (Addendum)
Out in wheelchair  With grandmother teaching complete  Case mangenment  Saw pt prior to d/c  D/c for home concerns   And transportation  issures

## 2017-08-11 ENCOUNTER — Ambulatory Visit (HOSPITAL_COMMUNITY): Payer: Self-pay

## 2017-08-11 ENCOUNTER — Encounter: Payer: Self-pay | Admitting: General Practice

## 2017-08-12 ENCOUNTER — Encounter: Payer: Self-pay | Admitting: Obstetrics & Gynecology

## 2017-08-12 ENCOUNTER — Telehealth: Payer: Self-pay | Admitting: *Deleted

## 2017-08-12 DIAGNOSIS — R21 Rash and other nonspecific skin eruption: Secondary | ICD-10-CM

## 2017-08-12 MED ORDER — HYDROCORTISONE 1 % EX CREA: 1.0000 "application " | TOPICAL_CREAM | Freq: Two times a day (BID) | CUTANEOUS | 0 refills | Status: AC

## 2017-08-12 NOTE — Clinical Social Work Maternal (Signed)
CLINICAL SOCIAL WORK MATERNAL/CHILD NOTE  Patient Details  Name: Olivia Simmons MRN: [id number] Date of Birth: 05/24/1996  Date:  2017-05-09  Clinical Social Worker Initiating Note:  Laurey Arrow Date/ Time Initiated:  08/10/17/1358     Child's Name:  Olivia Simmons   Legal Guardian:  Mother (FOB is Campbell 07/01/92)   Need for Interpreter:  None   Date of Referral:  04-14-2017     Reason for Referral:  Behavioral Health Issues, including SI    Referral Source:  NICU   Address:  [place] [address]  Phone number:  [phone]   Household Members:  Self, Parents, Siblings   Natural Supports (not living in the home):  Spouse/significant other, Immediate Family   Professional Supports: None (Referral made to [facility] for Counseling. )   Employment:     Type of Work:     Education:  Database administrator Resources:  Multimedia programmer   Other Resources:  [facility]   Cultural/Religious Considerations Which May Impact Care:  Per [employer] Face Sheet, MOB is [employer] Strengths:  Ability to meet basic needs , Pediatrician chosen , Home prepared for child , Understanding of illness   Risk Factors/Current Problems:  Transportation , Mental Health Concerns    Cognitive State:  Able to Concentrate , Alert , Linear Thinking , Insightful    Mood/Affect:  Calm , Happy , Relaxed , Tearful , Interested , Comfortable    CSW Assessment: CSW met with MOB in room 310.  When CSW arrived, MOB's parent's was present.  With MOB's permission, CSW asked MOB's parent's to leave in effort to meet with MOB in private. MOB was polite and receptive to meeting with CSW.  MOB was tearful during the assessment when MOB was explaining MOB's living situation.  CSW explored MOB's thoughts and feelings regarding infant's NICU admission.  MOB expressed feelings of sadness initially but happiness regarding the care that infant is receiving and  infant's progress. CSW validated and normalized infant's thoughts and feelings. CSW explained NICU visitation policy and encouraged MOB to ask questions.  CSW inquired about MOB's MH hx and MOB expressed that MOB has a hx of depression and is an established patient with [facility].  MOB stated that MOB is wanting to switch [facility] providers to a provider that is closer to MOB's home.  CSW suggested [facility] and MOB was receptive.  CSW provided MOB with contact information for the agency.  MOB had insight and awareness about her mental health and did not present with any acute signs or symptoms.  CSW assessed for safety and MOB denied HI, SI, and DV.  MOB became emotional while discussing her current living situation.  MOB stated that MOB currently resides with her parent's and at this time her parent's are not allowing FOB to reside with them.  CSW assisted MOB with processing her feelings and provided MOB with housing resources for independent living.  CSW provided education regarding Baby Blues vs PMADs and provided MOB with information about support groups held at [facility] encouraged MOB to evaluate her mental health throughout the postpartum period with the use of the New Mom Checklist developed by Postpartum [facility] and notify a medical professional if symptoms arise.    MOB reports being prepared for infant and having all necessary items. CSW assessed for barriers for MOB visiting with infant in NICU and MOB communicated transportation  concerns.  MOB will follow-up with CSW if transportation support is warranted.   CSW Plan/Description:  Information/Referral to [employer] , Engineer, mining , Psychosocial Support and Ongoing Assessment of Needs   Laurey Arrow, MSW, LCSW Clinical Social Work [phone]   Dimple Nanas, LCSW 08/12/2017, 11:01 AM

## 2017-08-12 NOTE — Telephone Encounter (Signed)
Patient called front desk and was transferred to me. She was trying to get a "cream" that the pharmacy said was not on her profile. Stated it was for a rash on her abdomen. Rx for hydrocortisone 1% cream sent to her pharmacy. Also c/o pain with having a bowel movement since having her baby, the prescribe stool softener is not helping. I recommended Miralax, take as instructed on the bottle. Patient voiced understanding. Patient also wanted assistance with [facility] [place]. Was particularly concerned with her oxycodone prescription. I told her that she can contact the pharmacy to transfer her prescriptions. As for the oxycodone, if she has not filled her prescription, she could have it filled anywhere as long as she still had the rx paper. Patient stated that she did and voiced understanding of instructions.

## 2017-08-23 ENCOUNTER — Ambulatory Visit (HOSPITAL_COMMUNITY): Payer: 59 | Attending: Obstetrics and Gynecology

## 2017-08-23 ENCOUNTER — Encounter (HOSPITAL_COMMUNITY): Payer: Self-pay

## 2017-09-20 ENCOUNTER — Ambulatory Visit: Payer: Self-pay | Admitting: Advanced Practice Midwife

## 2017-09-24 ENCOUNTER — Telehealth: Payer: Self-pay | Admitting: General Practice

## 2017-09-24 NOTE — Telephone Encounter (Signed)
Patient called and left message stating she had a 4th degree 6 weeks ago and she is having complications. Patient states it really hurts and burns when she goes to the bathroom. Called patient & a woman answered stating she isn't in but will tell her we called

## 2017-10-18 ENCOUNTER — Telehealth: Payer: Self-pay | Admitting: Clinical

## 2017-10-18 ENCOUNTER — Telehealth: Payer: Self-pay | Admitting: *Deleted

## 2017-10-18 NOTE — Telephone Encounter (Signed)
Appointment has been scheduled for 10/21/17.

## 2017-10-18 NOTE — Telephone Encounter (Addendum)
First call to mobile number went unanswered, no voicemail set up; unable to leave message.   Pt's mother answered home phone, and said that Lillia AbedLindsay is not home; HIPPA-compliant message left to call back Asher MuirJamie at [facility] at [phone].   [facility]  on 10/21/17@10 :00AM

## 2017-10-18 NOTE — Telephone Encounter (Signed)
Patient left message on nurse voicemail on 10/14/17 at 1114.  States she needs an appointment for postpartum depression.  Patient has missed several appointments.  I have asked Hulda MarinJamie McMannes, [facility] to contact patient and see if she can get her to agree to come in for an appointment.

## 2017-10-21 ENCOUNTER — Ambulatory Visit (INDEPENDENT_AMBULATORY_CARE_PROVIDER_SITE_OTHER): Payer: 59 | Admitting: Clinical

## 2017-10-21 DIAGNOSIS — F4322 Adjustment disorder with anxiety: Secondary | ICD-10-CM

## 2017-10-21 NOTE — BH Specialist Note (Signed)
Integrated Behavioral Health Follow Up Visit  MRN: [id number] Name: Olivia AdaLindsay C Broder  Number of Integrated Behavioral Health Clinician visits: 3/6 Session Start time: 10:50  Session End time: 11:15 Total time: 30 minutes  Type of Service: Integrated Behavioral Health- Individual/Family Interpretor:No. Interpretor Name and Language: n/a  SUBJECTIVE: Olivia Simmons is a 21 y.o. female accompanied by n/a Patient was referred by self for depression. Patient reports the following symptoms/concerns: Pt states her primary concern today is feeling irritable when she is unable to get enough sleep, forgets to eat after breakfast until evening, denies depression,  sleeps up to 6 hours at night, but less sleep in past couple of nights. Pt primary goal is to find work, and live on her own; currently living with her grandmother.  Pt states she will not take medication for irritability, and feels she is coping well, enjoys new motherhood, and states no SI, no HI.  Duration of problem: Postpartum; Severity of problem: mild  OBJECTIVE: Mood: Anxious and Affect: Appropriate Risk of harm to self or others: No plan to harm self or others  LIFE CONTEXT: Family and Social: Lives with 71mo son, in her grandmothers' house; Family and FOB supportive. Pt working with [employer] for housing School/Work: Actively seeking employment Self-Care: - Life Changes: Recent childbirth  GOALS ADDRESSED: Patient will: 1.  Reduce symptoms of: agitation and anxiety  2.  Increase knowledge and/or ability of: healthy habits  3.  Demonstrate ability to: Increase healthy adjustment to current life circumstances  INTERVENTIONS: Interventions utilized:  Sleep Hygiene Standardized Assessments completed: GAD-7 and PHQ 9  ASSESSMENT: Patient currently experiencing Adjustment disorder with anxious mood Patient may benefit from continued brief therapeutic intervention regarding coping with symptoms of  anxiety.  PLAN: 1. Follow up with behavioral health clinician on : Next medical appointment 2. Behavioral recommendations:  -When feelings of irritability come, consider taking a 5 minute walk (if able to go outside) or color in coloring book -Use sleep app for sounds at nighttime, for improved mom/baby sleep 3. Referral(s): Integrated Behavioral Health Services (In Clinic) 4. "From scale of 1-10, how likely are you to follow plan?": 7  Rae LipsJamie C Ndrew Creason, LCSWA  Depression screen [facility] 2/9 10/21/2017 06/17/2017 04/22/2017 03/15/2017  Decreased Interest 1 2 3 3   Down, Depressed, Hopeless 0 0 0 2  PHQ - 2 Score 1 2 3 5   Altered sleeping 1 2 2 2   Tired, decreased energy 2 2 1 2   Change in appetite 2 2 0 2  Feeling bad or failure about yourself  0 0 0 0  Trouble concentrating 0 0 0 0  Moving slowly or fidgety/restless 0 2 0 1  Suicidal thoughts 0 0 0 0  PHQ-9 Score 6 10 6 12   Difficult doing work/chores Not difficult at all - - -   GAD 7 : Generalized Anxiety Score 10/21/2017 06/17/2017 04/22/2017 03/15/2017  Nervous, Anxious, on Edge 0 3 0 3  Control/stop worrying 2 0 2 1  Worry too much - different things 2 0 2 2  Trouble relaxing 2 3 2 2   Restless 2 3 2  0  Easily annoyed or irritable 2 3 2 2   Afraid - awful might happen 0 0 2 2  Total GAD 7 Score 10 12 12 12   Anxiety Difficulty Somewhat difficult - - -

## 2017-10-22 ENCOUNTER — Ambulatory Visit (INDEPENDENT_AMBULATORY_CARE_PROVIDER_SITE_OTHER): Payer: 59 | Admitting: Medical

## 2017-10-22 ENCOUNTER — Encounter: Payer: Self-pay | Admitting: Medical

## 2017-10-22 ENCOUNTER — Other Ambulatory Visit (HOSPITAL_COMMUNITY)
Admission: RE | Admit: 2017-10-22 | Discharge: 2017-10-22 | Disposition: A | Discharge status: Home / Self Care | Payer: 59 | Source: Ambulatory Visit | Attending: Medical | Admitting: Medical

## 2017-10-22 VITALS — BP 115/72 | HR 82 | Ht 62.0 in | Wt 249.1 lb

## 2017-10-22 DIAGNOSIS — Z1389 Encounter for screening for other disorder: Secondary | ICD-10-CM | POA: Diagnosis not present

## 2017-10-22 DIAGNOSIS — N898 Other specified noninflammatory disorders of vagina: Secondary | ICD-10-CM | POA: Insufficient documentation

## 2017-10-22 DIAGNOSIS — Z8632 Personal history of gestational diabetes: Secondary | ICD-10-CM | POA: Insufficient documentation

## 2017-10-22 DIAGNOSIS — O9989 Other specified diseases and conditions complicating pregnancy, childbirth and the puerperium: Principal | ICD-10-CM

## 2017-10-22 DIAGNOSIS — O09899 Supervision of other high risk pregnancies, unspecified trimester: Secondary | ICD-10-CM

## 2017-10-22 DIAGNOSIS — Z283 Underimmunization status: Secondary | ICD-10-CM

## 2017-10-22 NOTE — Patient Instructions (Signed)

## 2017-10-22 NOTE — Progress Notes (Signed)
Subjective:     Olivia Simmons is a 21 y.o. female who presents for a postpartum visit. She is 9 weeks postpartum following a spontaneous vaginal delivery. I have fully reviewed the prenatal and intrapartum course. The delivery was at 3 gestational weeks. Outcome: spontaneous vaginal delivery. Anesthesia: epidural. Postpartum course has been normal. Baby's course has been normal. Baby is feeding by bottle - Similac Advance. Bleeding staining only. Bowel function is normal. Bladder function is normal. Patient is not sexually active. Contraception method is none. Postpartum depression screening: positive. Declines [place] today.   The following portions of the patient's history were reviewed and updated as appropriate: allergies, current medications, past family history, past medical history, past social history, past surgical history and problem list.  Review of Systems Pertinent items are noted in HPI.   Objective:    BP 115/72   Pulse 82   Ht '5\' 2"'$  (1.575 m)   Wt 249 lb 1.6 oz (113 kg)   LMP 11/23/2016 (LMP Unknown)   Breastfeeding? Unknown   BMI 45.56 kg/m   General:  alert and cooperative   Breasts:  not performed  Lungs: clear to auscultation bilaterally  Heart:  regular rate and rhythm, S1, S2 normal, no murmur, click, rub or gallop  Abdomen: soft, non-tender; bowel sounds normal; no masses,  no organomegaly   Vulva:  normal  Vagina: vagina positive for yellow discharge with odor  Cervix:  not evaluated  Corpus: not examined  Adnexa:  not evaluated  Rectal Exam: Not performed.        Assessment:     Normal postpartum exam. Pap smear not done at today's visit.  Normal at [facility] during pregnancy   Plan:    1. Contraception: condoms 2. Wet prep obtained and sent to the lab. Patient will be contact with results.  3. MMR received PP 4. Follow up in: 1-2 weeks fasting for PP 2 hour GTT.  Luvenia Redden, PA-C 10/22/2017 9:58 AM

## 2017-10-25 LAB — CERVICOVAGINAL ANCILLARY ONLY
Bacterial vaginitis: POSITIVE — AB
CANDIDA VAGINITIS: NEGATIVE
Trichomonas: NEGATIVE

## 2017-10-26 ENCOUNTER — Other Ambulatory Visit: Payer: Self-pay | Admitting: Medical

## 2017-10-26 ENCOUNTER — Telehealth: Payer: Self-pay | Admitting: General Practice

## 2017-10-26 DIAGNOSIS — B9689 Other specified bacterial agents as the cause of diseases classified elsewhere: Secondary | ICD-10-CM

## 2017-10-26 DIAGNOSIS — N760 Acute vaginitis: Principal | ICD-10-CM

## 2017-10-26 MED ORDER — METRONIDAZOLE 500 MG PO TABS: 500.0000 mg | ORAL_TABLET | Freq: Two times a day (BID) | ORAL | 0 refills | Status: DC

## 2017-10-26 MED ORDER — METRONIDAZOLE 500 MG PO TABS: 500.0000 mg | ORAL_TABLET | Freq: Two times a day (BID) | ORAL | 0 refills | Status: AC

## 2017-10-26 NOTE — Telephone Encounter (Signed)
-----   Message from Marny LowensteinJulie N Wenzel, PA-C sent at 10/26/2017  9:48 AM EST ----- Patient has BV. Rx sent to pharmacy. Please inform patient of dx and rx. Also, advise to sign up for MyChart for future results if possible.   Marny LowensteinWenzel, Julie N, PA-C 10/26/2017 9:48 AM

## 2017-10-26 NOTE — Telephone Encounter (Signed)
Called patient & informed her of results, medication at pharmacy & to avoid alcohol while on the medication. Patient verbalized understanding and asked what causes that. Told patient it could be any number of things such as exposure to sperm, scented detergents or soaps, douching, etc but the root cause is an overgrowth of bacteria in the vagina. Patient verbalized understanding & had no other questions

## 2017-11-05 ENCOUNTER — Other Ambulatory Visit: Payer: Self-pay

## 2018-04-08 IMAGING — US US MFM OB DETAIL+14 WK
1 series · 14 of 28 positions shown · non-contrast
Comparison: none

[Series 1: us mfm ob detail+14 wk · 14 of 119 slices shown]
[im 5/119]
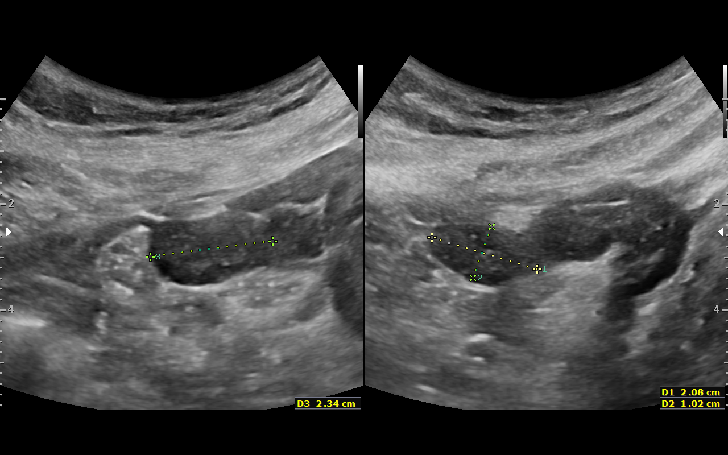
[im 14/119]
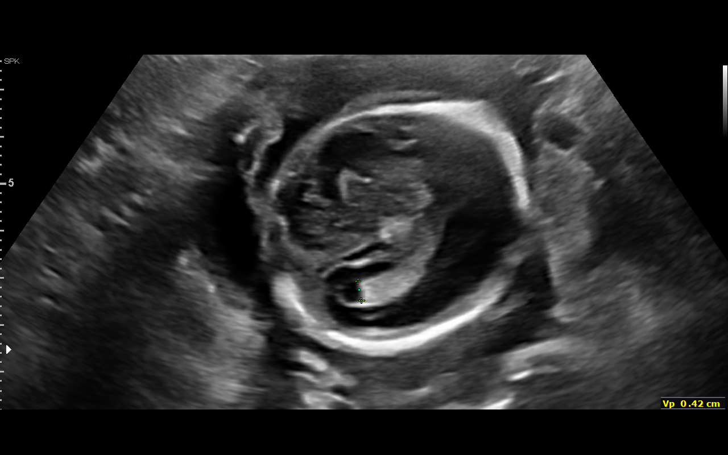
[im 22/119]
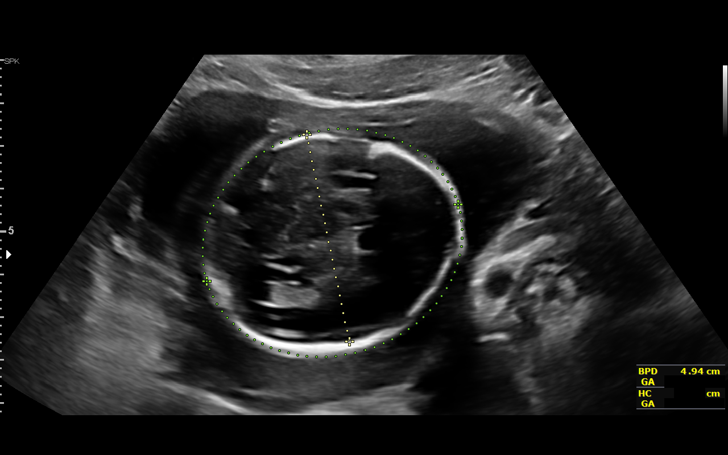
[im 31/119]
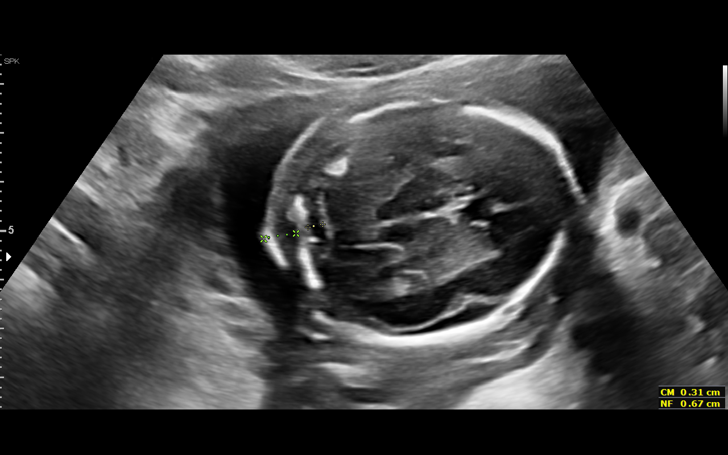
[im 40/119]
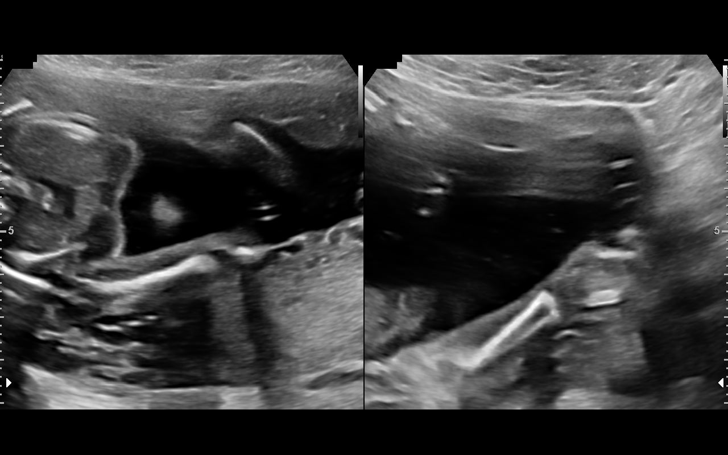
[im 49/119]
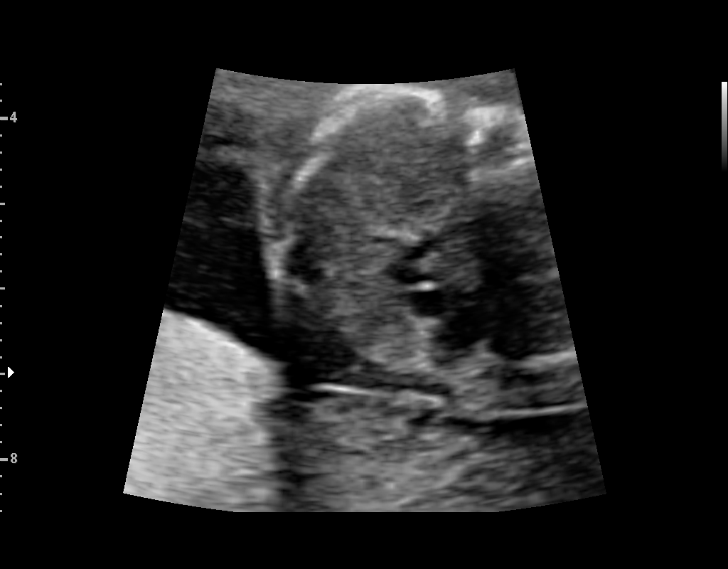
[im 57/119]
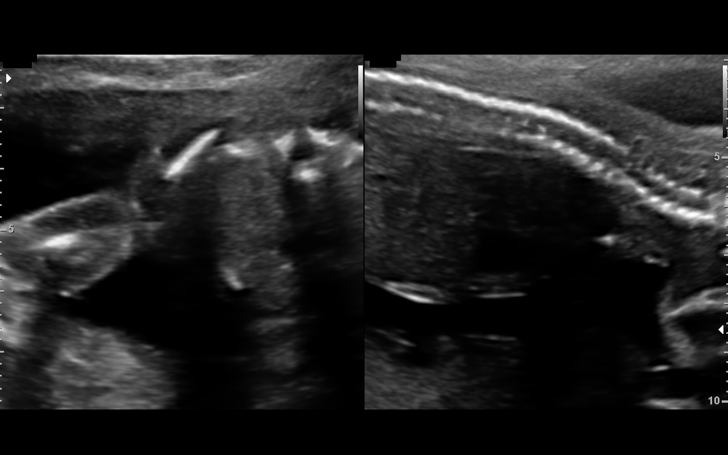
[im 66/119]
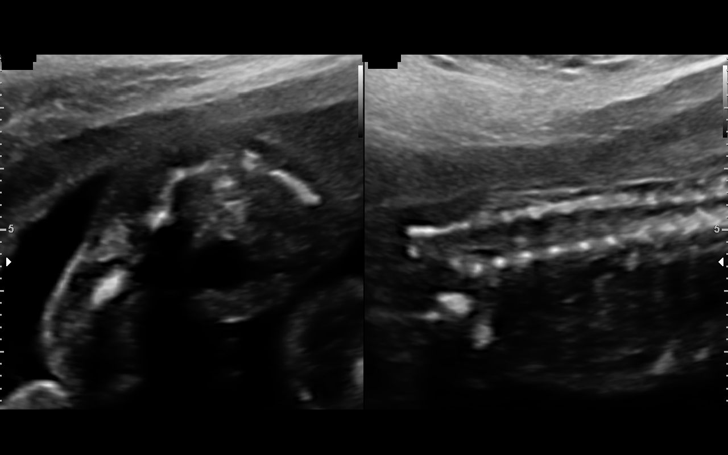
[im 75/119]
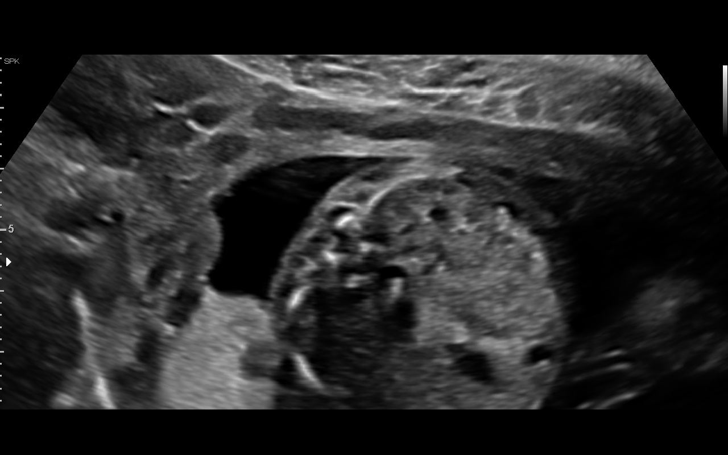
[im 84/119]
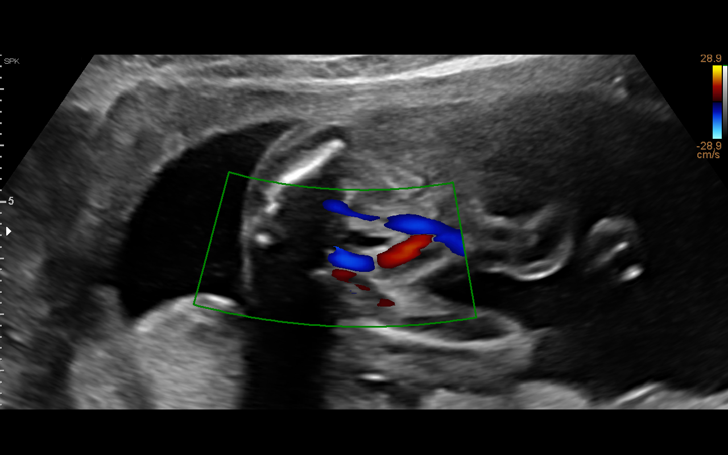
[im 92/119]
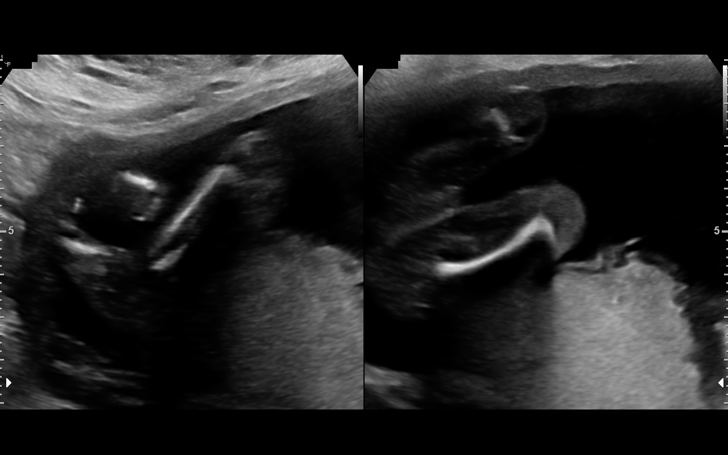
[im 101/119]
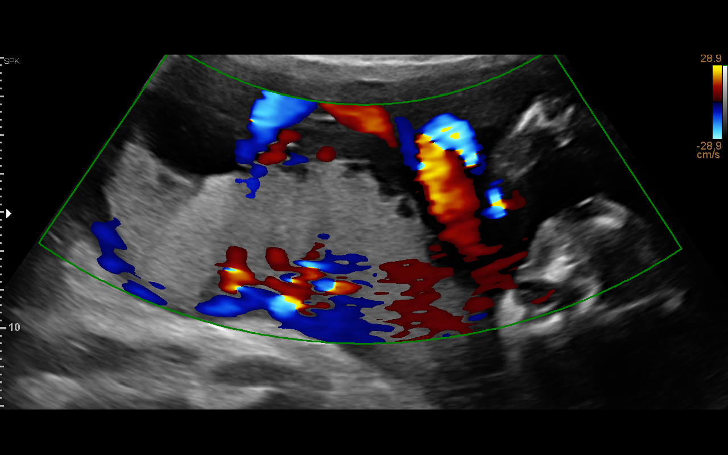
[im 110/119]
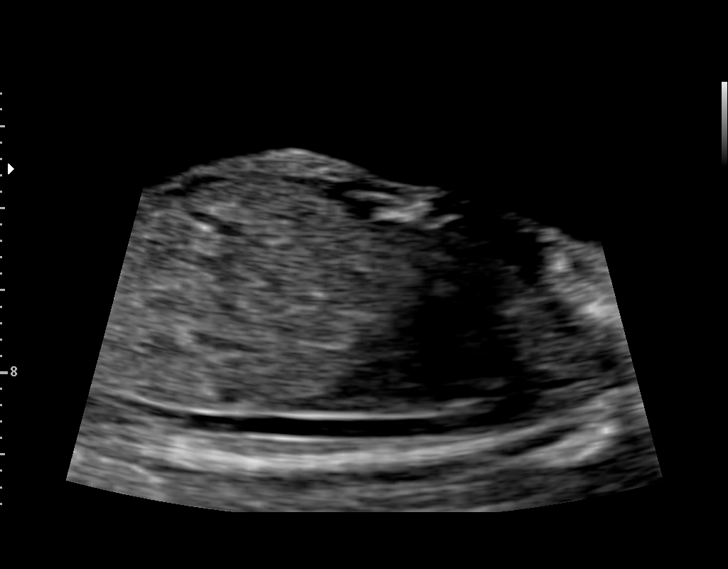
[im 119/119]
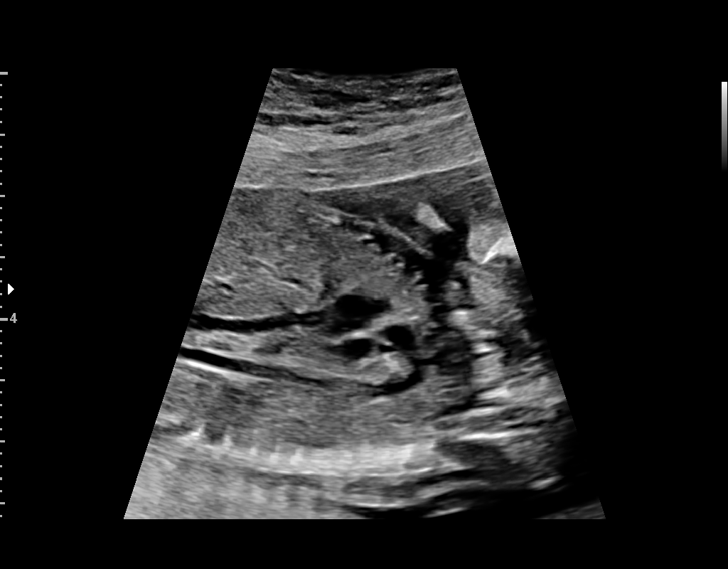

[14 of 28 positions shown; findings below may reference images not displayed]

1  NELISILE SASH              [id number]      [id number]     [id number]
Indications

19 weeks gestation of pregnancy
Obesity complicating pregnancy, second
trimester
Hypertension
Encounter for antenatal screening,
unspecified
OB History

Blood Type:            Height:  5'2"   Weight (lb):  225      BMI:
Gravidity:    1         Term:   0        Prem:   0        SAB:   0
TOP:          0       Ectopic:  0        Living: 0
Fetal Evaluation

Num Of Fetuses:     1
Fetal Heart         142
Rate(bpm):
Cardiac Activity:   Observed
Presentation:       Vertex
Placenta:           Posterior, above cervical os
P. Cord Insertion:  Visualized, central

Amniotic Fluid
AFI FV:      Subjectively within normal limits

Largest Pocket(cm)
3.34
Biometry
BPD:      48.9  mm     G. Age:  20w 5d         98  %    CI:        75.54   %   70 - 86
FL/HC:      18.0   %   16.1 -
HC:      178.4  mm     G. Age:  20w 2d         92  %    HC/AC:      1.17       1.09 -
AC:      152.1  mm     G. Age:  20w 3d         86  %    FL/BPD:     65.8   %
FL:       32.2  mm     G. Age:  20w 0d         78  %    FL/AC:      21.2   %   20 - 24
HUM:      29.6  mm     G. Age:  19w 5d         68  %
CER:      20.2  mm     G. Age:  19w 1d         55  %
NFT:       7.2  mm
CM:        2.8  mm

Est. FW:     343  gm    0 lb 12 oz      64  %
Gestational Age

LMP:           19w 0d       Date:   11/23/16                 EDD:   08/30/17
U/S Today:     20w 3d                                        EDD:   08/20/17
Best:          19w 0d    Det. By:   LMP  (11/23/16)          EDD:   08/30/17
Anatomy

Cranium:               Appears normal         Aortic Arch:            Appears normal
Cavum:                 Appears normal         Ductal Arch:            Appears normal
Ventricles:            Appears normal         Diaphragm:              Appears normal
Choroid Plexus:        Appears normal         Stomach:                Appears normal, left
sided
Cerebellum:            Appears normal         Abdomen:                Appears normal
Posterior Fossa:       Appears normal         Abdominal Wall:         Appears nml (cord
insert, abd wall)
Nuchal Fold:           Appears normal         Cord Vessels:           Appears normal (3
vessel cord)
Face:                  Appears normal         Kidneys:                Appear normal
(orbits and profile)
Lips:                  Appears normal         Bladder:                Appears normal
Thoracic:              Appears normal         Spine:                  Appears normal
Heart:                 Appears normal         Upper Extremities:      Appears normal
(4CH, axis, and situs
RVOT:                  Appears normal         Lower Extremities:      Appears normal
LVOT:                  Appears normal

Other:  Fetus appears to be a male. Both heels and 5th digits noted
Cervix Uterus Adnexa

Cervix
Length:           3.09  cm.
Normal appearance by transabdominal scan.

Uterus
No abnormality visualized.

Left Ovary
Within normal limits.

Right Ovary
Within normal limits.

Adnexa:       No abnormality visualized.
Impression

Single IUP at 19w 0d
Obesity, chronic hypertension
Normal fetal anatomic survey
Ultrasound measurements are consistent with LMP
Posterior placenta without previa
Normal amniotic fluid volume
Recommendations

Recommend follow-up ultrasound examination in 6 weeks for
interval growth
